# Patient Record
Sex: Male | Born: 1965
Health system: Southern US, Community
[De-identification: ages and names within clinical notes are randomized; demographics above are authoritative.]

## PROBLEM LIST (undated history)

## (undated) DIAGNOSIS — J45909 Unspecified asthma, uncomplicated: Secondary | ICD-10-CM

## (undated) DIAGNOSIS — K219 Gastro-esophageal reflux disease without esophagitis: Secondary | ICD-10-CM

## (undated) HISTORY — PX: MR SHOULDER RIGHT: HXRAD1806

---

## 2001-04-26 ENCOUNTER — Encounter: Admission: RE | Admit: 2001-04-26 | Discharge: 2001-04-26 | Payer: Self-pay | Admitting: Family Medicine

## 2001-04-26 ENCOUNTER — Encounter: Payer: Self-pay | Admitting: Family Medicine

## 2007-01-02 ENCOUNTER — Encounter: Admission: RE | Admit: 2007-01-02 | Discharge: 2007-01-02 | Payer: Self-pay | Admitting: Family Medicine

## 2007-03-20 ENCOUNTER — Ambulatory Visit (HOSPITAL_BASED_OUTPATIENT_CLINIC_OR_DEPARTMENT_OTHER): Admission: RE | Admit: 2007-03-20 | Discharge: 2007-03-20 | Payer: Self-pay | Admitting: Orthopaedic Surgery

## 2008-05-26 ENCOUNTER — Encounter: Admission: RE | Admit: 2008-05-26 | Discharge: 2008-05-26 | Payer: Self-pay | Admitting: Family Medicine

## 2010-11-11 ENCOUNTER — Encounter
Admission: RE | Admit: 2010-11-11 | Discharge: 2010-11-11 | Payer: Self-pay | Source: Home / Self Care | Attending: Family Medicine | Admitting: Family Medicine

## 2011-03-25 NOTE — Op Note (Signed)
Russell Benton, AROCHO             ACCOUNT NO.:  [id number]   MEDICAL RECORD NO.:  [id number]          PATIENT TYPE:  AMB   LOCATION:  DSC                          FACILITY:  [facility]   PHYSICIAN:  Lubertha Basque. Dalldorf, M.D.DATE OF BIRTH:  Jun 13, 1966   DATE OF PROCEDURE:  03/20/2007  DATE OF DISCHARGE:                               OPERATIVE REPORT   PREOPERATIVE DIAGNOSIS:  Left knee torn medial meniscus.   POSTOPERATIVE DIAGNOSIS:  Left knee torn medial meniscus.   PROCEDURE:  Left knee partial medial meniscectomy.   ANESTHESIA:  General.   ATTENDING SURGEON:  Lubertha Basque. Jerl Santos, M.D.   INDICATIONS FOR PROCEDURE:  The patient is a 45 year old man with about  a year of left knee pain [place]  This has persisted despite oral anti-inflammatories and activity  restriction.  He had undergone MRI scan which shows a medial meniscus  tear.  He has pain which limits his ability to remain active and to rest  and he is offered an arthroscopy.  Informed operative consent was  obtained after discussion of possible complications of reaction to  anesthesia and infection.  He is about 15 or 16 years from an  arthroscopy on this same knee.   SUMMARY OF FINDINGS AND PROCEDURE:  Under general anesthesia, an  arthroscopy left knee was performed.  The suprapatellar pouch was benign  as was the patellofemoral joint.  Medial compartment exhibited an  incarcerated medial meniscus tear of the middle horn.  This was pulled  into the joint followed by about 5% partial medial meniscectomy back to  stable tissues.  There were no degenerative changes to speak of.  The  lateral compartment was completely benign with no evidence of meniscal  or articular cartilage injury.  The ACL appeared to be intact.   DESCRIPTION OF PROCEDURE:  The patient taken to operating suite where  general anesthetic was applied without difficulty.  Positioned supine  and prepped, draped  normal sterile fashion.  After administration of  preop IV Kefzol an arthroscopy left knee was performed through a total  of two portals.  Findings were as noted above and procedure consisted of  the partial medial meniscectomy.  This was done by pulling the  incarcerated fragment into the medial compartment and then I used the  shaver to trim this back to stable tissues.  The knee was thoroughly  examined.  No other evidence of injury could be found.  The knee was  irrigated followed by placement of Marcaine with epinephrine and  morphine.  Adaptic was placed over the portals followed by dry gauze and  loose Ace wrap.  Estimated blood loss and intraoperative fluids can be  obtained from anesthesia records.   DISPOSITION:  The patient was extubated in the operating room and taken  to recovery room in stable addition.  He was to go home same-day follow  up in the office less than a week.  I will contact him by phone tonight.      Lubertha Basque Jerl Santos, M.D.  Electronically Signed  PGD/MEDQ  D:  03/20/2007  T:  03/20/2007  Job:  [id number]

## 2012-03-09 ENCOUNTER — Ambulatory Visit
Admission: RE | Admit: 2012-03-09 | Discharge: 2012-03-09 | Disposition: A | Discharge status: Home / Self Care | Payer: 59 | Source: Ambulatory Visit | Attending: Family Medicine | Admitting: Family Medicine

## 2012-03-09 ENCOUNTER — Other Ambulatory Visit: Payer: Self-pay | Admitting: Family Medicine

## 2012-03-09 DIAGNOSIS — T148XXA Other injury of unspecified body region, initial encounter: Secondary | ICD-10-CM

## 2016-12-14 ENCOUNTER — Other Ambulatory Visit: Payer: Self-pay | Admitting: Family Medicine

## 2016-12-14 ENCOUNTER — Ambulatory Visit
Admission: RE | Admit: 2016-12-14 | Discharge: 2016-12-14 | Disposition: A | Discharge status: Home / Self Care | Payer: 59 | Source: Ambulatory Visit | Attending: Family Medicine | Admitting: Family Medicine

## 2016-12-14 DIAGNOSIS — S6992XA Unspecified injury of left wrist, hand and finger(s), initial encounter: Secondary | ICD-10-CM | POA: Diagnosis not present

## 2016-12-14 DIAGNOSIS — M79645 Pain in left finger(s): Secondary | ICD-10-CM

## 2016-12-14 DIAGNOSIS — M7989 Other specified soft tissue disorders: Secondary | ICD-10-CM | POA: Diagnosis not present

## 2016-12-20 DIAGNOSIS — M79645 Pain in left finger(s): Secondary | ICD-10-CM | POA: Diagnosis not present

## 2017-06-09 DIAGNOSIS — Z23 Encounter for immunization: Secondary | ICD-10-CM | POA: Diagnosis not present

## 2017-06-09 DIAGNOSIS — Z13220 Encounter for screening for lipoid disorders: Secondary | ICD-10-CM | POA: Diagnosis not present

## 2017-06-09 DIAGNOSIS — Z0000 Encounter for general adult medical examination without abnormal findings: Secondary | ICD-10-CM | POA: Diagnosis not present

## 2017-07-20 ENCOUNTER — Other Ambulatory Visit: Payer: Self-pay | Admitting: Family Medicine

## 2017-07-20 ENCOUNTER — Ambulatory Visit
Admission: RE | Admit: 2017-07-20 | Discharge: 2017-07-20 | Disposition: A | Discharge status: Home / Self Care | Payer: 59 | Source: Ambulatory Visit | Attending: Family Medicine | Admitting: Family Medicine

## 2017-07-20 DIAGNOSIS — J45909 Unspecified asthma, uncomplicated: Secondary | ICD-10-CM | POA: Diagnosis not present

## 2017-07-20 DIAGNOSIS — Z77090 Contact with and (suspected) exposure to asbestos: Secondary | ICD-10-CM

## 2017-07-20 DIAGNOSIS — R0602 Shortness of breath: Secondary | ICD-10-CM | POA: Diagnosis not present

## 2017-07-20 DIAGNOSIS — J309 Allergic rhinitis, unspecified: Secondary | ICD-10-CM | POA: Diagnosis not present

## 2017-08-22 DIAGNOSIS — Z23 Encounter for immunization: Secondary | ICD-10-CM | POA: Diagnosis not present

## 2018-04-05 DIAGNOSIS — H5213 Myopia, bilateral: Secondary | ICD-10-CM | POA: Diagnosis not present

## 2018-04-05 DIAGNOSIS — H524 Presbyopia: Secondary | ICD-10-CM | POA: Diagnosis not present

## 2018-06-11 DIAGNOSIS — Z136 Encounter for screening for cardiovascular disorders: Secondary | ICD-10-CM | POA: Diagnosis not present

## 2018-06-11 DIAGNOSIS — Z0000 Encounter for general adult medical examination without abnormal findings: Secondary | ICD-10-CM | POA: Diagnosis not present

## 2018-06-11 DIAGNOSIS — Z1159 Encounter for screening for other viral diseases: Secondary | ICD-10-CM | POA: Diagnosis not present

## 2019-03-25 DIAGNOSIS — Z0000 Encounter for general adult medical examination without abnormal findings: Secondary | ICD-10-CM | POA: Diagnosis not present

## 2019-03-25 DIAGNOSIS — Z125 Encounter for screening for malignant neoplasm of prostate: Secondary | ICD-10-CM | POA: Diagnosis not present

## 2019-03-25 DIAGNOSIS — Z13220 Encounter for screening for lipoid disorders: Secondary | ICD-10-CM | POA: Diagnosis not present

## 2019-03-25 DIAGNOSIS — Z23 Encounter for immunization: Secondary | ICD-10-CM | POA: Diagnosis not present

## 2019-07-13 IMAGING — CR DG CHEST 2V
2 series · 2 of 2 positions shown · non-contrast
Comparison: None.

CLINICAL DATA: History of asbestos exposure.

EXAM:
CHEST  2 VIEW

[w chest pa]
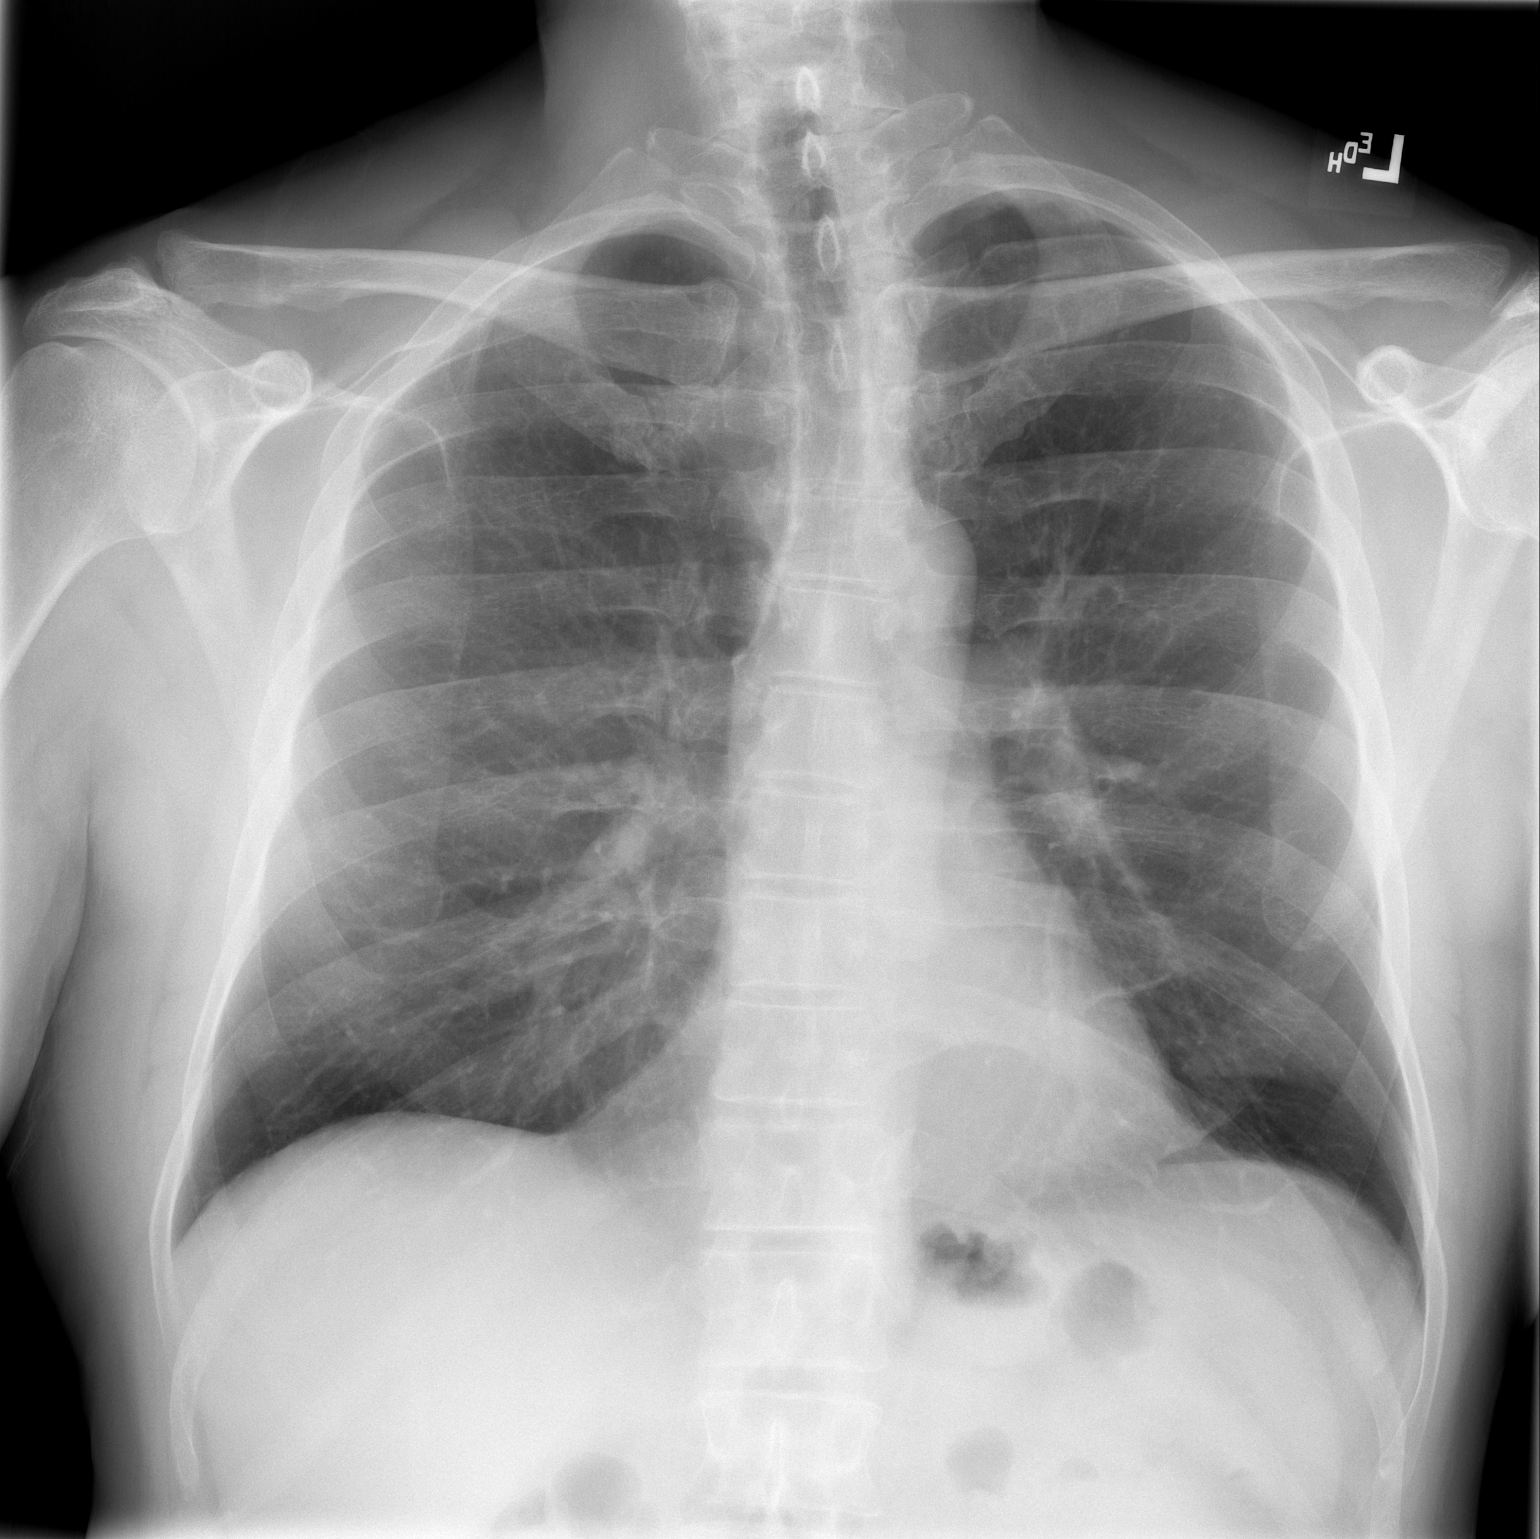

[w chest lat]
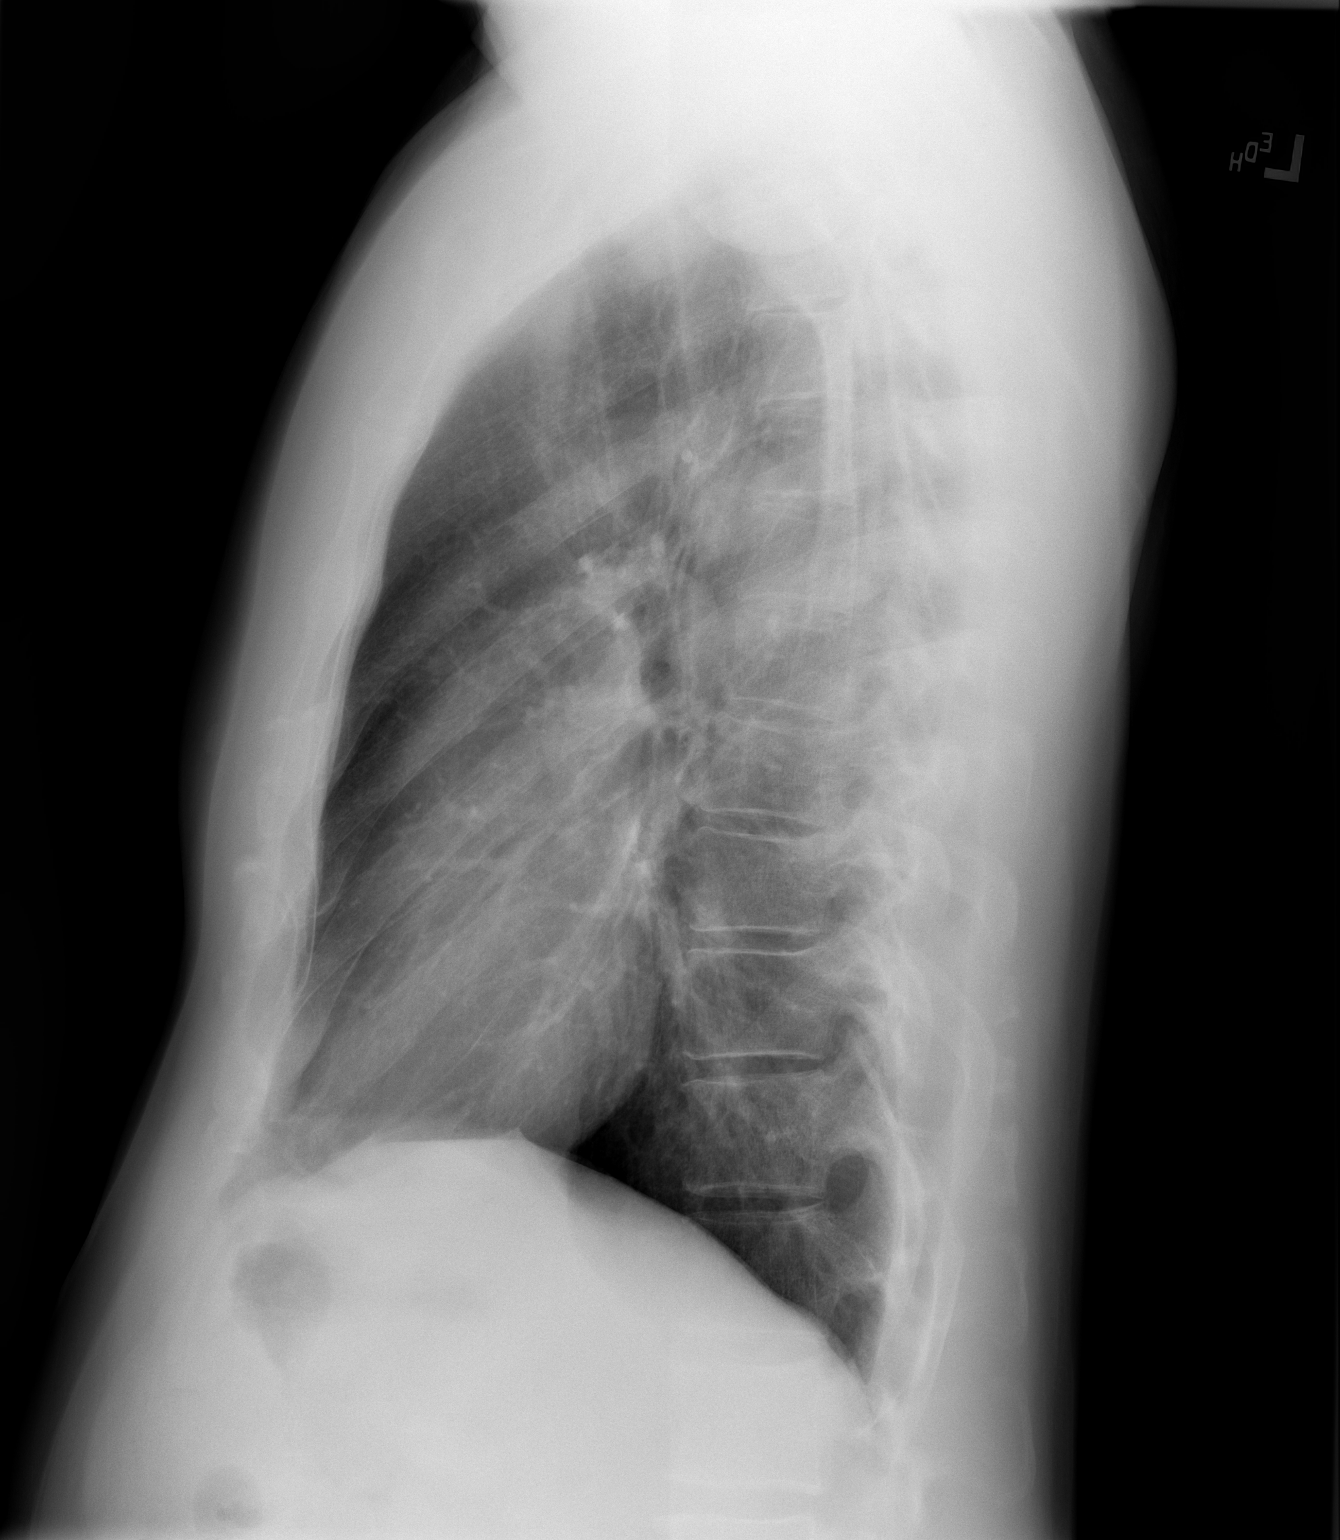

[2 of 2 positions shown; findings below may reference images not displayed]

FINDINGS: The heart size and mediastinal contours are within normal limits.
Both lungs are clear. The visualized skeletal structures are
unremarkable.
IMPRESSION: No active cardiopulmonary disease.

## 2019-09-09 ENCOUNTER — Other Ambulatory Visit: Payer: Self-pay | Admitting: Physician Assistant

## 2019-09-09 ENCOUNTER — Ambulatory Visit
Admission: RE | Admit: 2019-09-09 | Discharge: 2019-09-09 | Disposition: A | Discharge status: Home / Self Care | Payer: 59 | Source: Ambulatory Visit | Attending: Physician Assistant | Admitting: Physician Assistant

## 2019-09-09 DIAGNOSIS — T1490XA Injury, unspecified, initial encounter: Secondary | ICD-10-CM

## 2020-03-02 ENCOUNTER — Encounter (HOSPITAL_BASED_OUTPATIENT_CLINIC_OR_DEPARTMENT_OTHER): Payer: Self-pay | Admitting: Otolaryngology

## 2020-03-02 ENCOUNTER — Other Ambulatory Visit: Payer: Self-pay

## 2020-03-03 NOTE — H&P (Signed)
HPI:   Russell Benton is a 54 y.o. male who presents as a new Patient.   Referring Provider: Self, A Referral  Chief complaint: Nasal and throat congestion.  HPI: The past year or so he has had a problem where at night his nose gets very congested. He has been having fits of sneezing and nasal discharge. He has had to blow his nose a lot. He also has postnasal drainage. During the day he seems to be fine but is always at night. He also suffers with significant heartburn. He takes Tums about 4 or 5 times each week. He quit smoking years ago. He has cut back on alcohol recently but normally drinks 4-5 drinks in the evening. He also has cut back on caffeine, he currently drinks about 3 cups of coffee daily but used to drink a whole pot every day. He has been treated with antihistamines and antibiotics without much relief.  PMH/Meds/All/SocHx/FamHx/ROS:   Past Medical History:  Diagnosis Date  . Anxiety  . Asthma  . Migraine   Past Surgical History:  Procedure Laterality Date  . knee surgery   No family history of bleeding disorders, wound healing problems or difficulty with anesthesia.   Social History   Socioeconomic History  . Marital status: Married  Spouse name: Not on file  . Number of children: Not on file  . Years of education: Not on file  . Highest education level: Not on file  Occupational History  . Not on file  Social Needs  . Financial resource strain: Not on file  . Food insecurity  Worry: Not on file  Inability: Not on file  . Transportation needs  Medical: Not on file  Non-medical: Not on file  Tobacco Use  . Smoking status: Former Research scientist (life sciences)  . Smokeless tobacco: Never Used  Substance and Sexual Activity  . Alcohol use: Yes  . Drug use: No  . Sexual activity: Not on file  Lifestyle  . Physical activity  Days per week: Not on file  Minutes per session: Not on file  . Stress: Not on file  Relationships  . Social Medical illustrator on phone: Not on  file  Gets together: Not on file  Attends religious service: Not on file  Active member of club or organization: Not on file  Attends meetings of clubs or organizations: Not on file  Relationship status: Not on file  Other Topics Concern  . Not on file  Social History Narrative  . Not on file   No current outpatient medications on file.  A complete ROS was performed with pertinent positives/negatives noted in the HPI. The remainder of the ROS are negative.   Physical Exam:   BP 127/74  Pulse 66  Temp 97.3 F (36.3 C)  Ht 1.829 m (6')  Wt 90.3 kg (199 lb)  BMI 26.99 kg/m   General: Healthy and alert, in no distress, breathing easily. Normal affect. In a pleasant mood. Head: Normocephalic, atraumatic. No masses, or scars. Eyes: Pupils are equal, and reactive to light. Vision is grossly intact. No spontaneous or gaze nystagmus. Ears: Ear canals are clear. Tympanic membranes are intact, with normal landmarks and the middle ears are clear and healthy. Hearing: Grossly normal. Nose: Nasal cavities are patent with mild mucosal congestion and mucus exudate bilaterally but no polyps. Airways are patent. Face: No masses or scars, facial nerve function is symmetric. Oral Cavity: No mucosal abnormalities are noted. Tongue with normal mobility. Dentition appears healthy. Oropharynx: Tonsils are  symmetric. There are no mucosal masses identified. Tongue base appears normal and healthy. Larynx/Hypopharynx: deferred Chest: Deferred Neck: No palpable masses, no cervical adenopathy, no thyroid nodules or enlargement. Neuro: Cranial nerves II-XII with normal function. Balance: Normal gate. Other findings: none.  Independent Review of Additional Tests or Records:  none  Procedures:  none  Impression & Plans:  Chronic nocturnal nasal congestion with nasal discharge and postnasal drainage. He did not respond to amoxicillin. Recommend CT imaging of the sinuses to rule out any persistent  sinus disease.  Some of his symptoms are reflux related.We discussed causes of reflux, including lifestyle and dietary factors. Recommend strict avoidance of all tobacco, caffeine, alcohol, chocolate and peppermint. A reflux handout with more detailed instructions was provided to the patient.   CT scan reveals chronic pansinusitis. Recommend 2 weeks on clindamycin and then reevaluate.We discussed the importance of eating active or live culture yoghurt 2-3 times daily while taking the antibiotics. This can help avoid GI side effects.

## 2020-03-05 ENCOUNTER — Other Ambulatory Visit (HOSPITAL_COMMUNITY)
Admission: RE | Admit: 2020-03-05 | Discharge: 2020-03-05 | Disposition: A | Discharge status: Home / Self Care | Payer: 59 | Source: Ambulatory Visit | Attending: Otolaryngology | Admitting: Otolaryngology

## 2020-03-05 DIAGNOSIS — Z20822 Contact with and (suspected) exposure to covid-19: Secondary | ICD-10-CM | POA: Insufficient documentation

## 2020-03-05 DIAGNOSIS — Z01812 Encounter for preprocedural laboratory examination: Secondary | ICD-10-CM | POA: Diagnosis not present

## 2020-03-06 ENCOUNTER — Other Ambulatory Visit (HOSPITAL_COMMUNITY): Payer: 59

## 2020-03-06 LAB — SARS CORONAVIRUS 2 (TAT 6-24 HRS): SARS Coronavirus 2: NEGATIVE

## 2020-03-08 NOTE — Anesthesia Preprocedure Evaluation (Addendum)
Anesthesia Evaluation  Patient identified by MRN, date of birth, ID band Patient awake    Reviewed: Allergy & Precautions, NPO status , Patient's Chart, lab work & pertinent test results  History of Anesthesia Complications Negative for: history of anesthetic complications  Airway Mallampati: II  TM Distance: >3 FB Neck ROM: Full    Dental  (+) Dental Advisory Given, Teeth Intact   Pulmonary asthma ,    Pulmonary exam normal        Cardiovascular negative cardio ROS Normal cardiovascular exam     Neuro/Psych negative neurological ROS  negative psych ROS   GI/Hepatic Neg liver ROS, GERD  Controlled,  Endo/Other  negative endocrine ROS  Renal/GU negative Renal ROS     Musculoskeletal negative musculoskeletal ROS (+)   Abdominal   Peds  Hematology negative hematology ROS (+)   Anesthesia Other Findings Covid 4/29  Reproductive/Obstetrics                            Anesthesia Physical Anesthesia Plan  ASA: II  Anesthesia Plan: General   Post-op Pain Management:    Induction: Intravenous  PONV Risk Score and Plan: 2 and Treatment may vary due to age or medical condition, Ondansetron, Dexamethasone and Midazolam  Airway Management Planned: Oral ETT  Additional Equipment: None  Intra-op Plan:   Post-operative Plan: Extubation in OR  Informed Consent: I have reviewed the patients History and Physical, chart, labs and discussed the procedure including the risks, benefits and alternatives for the proposed anesthesia with the patient or authorized representative who has indicated his/her understanding and acceptance.     Dental advisory given  Plan Discussed with: CRNA and Anesthesiologist  Anesthesia Plan Comments:        Anesthesia Quick Evaluation

## 2020-03-09 ENCOUNTER — Ambulatory Visit (HOSPITAL_BASED_OUTPATIENT_CLINIC_OR_DEPARTMENT_OTHER): Payer: 59 | Admitting: Anesthesiology

## 2020-03-09 ENCOUNTER — Other Ambulatory Visit: Payer: Self-pay

## 2020-03-09 ENCOUNTER — Ambulatory Visit (HOSPITAL_BASED_OUTPATIENT_CLINIC_OR_DEPARTMENT_OTHER)
Admission: RE | Admit: 2020-03-09 | Discharge: 2020-03-09 | Disposition: A | Discharge status: Home / Self Care | Payer: 59 | Attending: Otolaryngology | Admitting: Otolaryngology

## 2020-03-09 ENCOUNTER — Encounter (HOSPITAL_BASED_OUTPATIENT_CLINIC_OR_DEPARTMENT_OTHER)
Admission: RE | Disposition: A | Discharge status: Home / Self Care | Payer: Self-pay | Source: Home / Self Care | Attending: Otolaryngology

## 2020-03-09 ENCOUNTER — Encounter (HOSPITAL_BASED_OUTPATIENT_CLINIC_OR_DEPARTMENT_OTHER): Payer: Self-pay | Admitting: Otolaryngology

## 2020-03-09 DIAGNOSIS — J324 Chronic pansinusitis: Secondary | ICD-10-CM | POA: Diagnosis not present

## 2020-03-09 DIAGNOSIS — Z87891 Personal history of nicotine dependence: Secondary | ICD-10-CM | POA: Insufficient documentation

## 2020-03-09 DIAGNOSIS — J45909 Unspecified asthma, uncomplicated: Secondary | ICD-10-CM | POA: Insufficient documentation

## 2020-03-09 HISTORY — PX: NASAL SINUS SURGERY: SHX719

## 2020-03-09 HISTORY — DX: Gastro-esophageal reflux disease without esophagitis: K21.9

## 2020-03-09 HISTORY — DX: Unspecified asthma, uncomplicated: J45.909

## 2020-03-09 SURGERY — SINUS SURGERY, ENDOSCOPIC
Anesthesia: General | Site: Nose | Laterality: Bilateral

## 2020-03-09 MED ORDER — HYDROCODONE-ACETAMINOPHEN 7.5-325 MG PO TABS: 1.0000 | ORAL_TABLET | Freq: Four times a day (QID) | ORAL | 0 refills | Status: DC | PRN

## 2020-03-09 MED ORDER — OXYCODONE HCL 5 MG PO TABS: 5.0000 mg | ORAL_TABLET | Freq: Once | ORAL | Status: DC | PRN

## 2020-03-09 MED ORDER — FENTANYL CITRATE (PF) 100 MCG/2ML IJ SOLN
25.0000 ug | INTRAMUSCULAR | Status: DC | PRN
  Administered 2020-03-09 (×3): 50 ug via INTRAVENOUS

## 2020-03-09 MED ORDER — MIDAZOLAM HCL 2 MG/2ML IJ SOLN
INTRAMUSCULAR | Status: AC
  Filled 2020-03-09: qty 2

## 2020-03-09 MED ORDER — SUGAMMADEX SODIUM 200 MG/2ML IV SOLN
INTRAVENOUS | Status: DC | PRN
  Administered 2020-03-09: 200 mg via INTRAVENOUS

## 2020-03-09 MED ORDER — PROMETHAZINE HCL 25 MG RE SUPP: 25.0000 mg | Freq: Four times a day (QID) | RECTAL | 1 refills | Status: DC | PRN

## 2020-03-09 MED ORDER — FENTANYL CITRATE (PF) 100 MCG/2ML IJ SOLN: 50.0000 ug | INTRAMUSCULAR | Status: DC | PRN

## 2020-03-09 MED ORDER — FENTANYL CITRATE (PF) 250 MCG/5ML IJ SOLN
INTRAMUSCULAR | Status: DC | PRN
  Administered 2020-03-09 (×2): 50 ug via INTRAVENOUS
  Administered 2020-03-09: 100 ug via INTRAVENOUS

## 2020-03-09 MED ORDER — LIDOCAINE-EPINEPHRINE 1 %-1:100000 IJ SOLN
INTRAMUSCULAR | Status: AC
  Filled 2020-03-09: qty 1

## 2020-03-09 MED ORDER — LACTATED RINGERS IV SOLN: INTRAVENOUS | Status: DC

## 2020-03-09 MED ORDER — FENTANYL CITRATE (PF) 100 MCG/2ML IJ SOLN
INTRAMUSCULAR | Status: AC
  Filled 2020-03-09: qty 2

## 2020-03-09 MED ORDER — LIDOCAINE-EPINEPHRINE 1 %-1:100000 IJ SOLN
INTRAMUSCULAR | Status: DC | PRN
  Administered 2020-03-09: 9 mL

## 2020-03-09 MED ORDER — OXYMETAZOLINE HCL 0.05 % NA SOLN
NASAL | Status: AC
  Filled 2020-03-09: qty 30

## 2020-03-09 MED ORDER — ROCURONIUM BROMIDE 10 MG/ML (PF) SYRINGE
PREFILLED_SYRINGE | INTRAVENOUS | Status: DC | PRN
  Administered 2020-03-09: 50 mg via INTRAVENOUS

## 2020-03-09 MED ORDER — MIDAZOLAM HCL 5 MG/5ML IJ SOLN
INTRAMUSCULAR | Status: DC | PRN
  Administered 2020-03-09: 2 mg via INTRAVENOUS

## 2020-03-09 MED ORDER — DEXAMETHASONE SODIUM PHOSPHATE 10 MG/ML IJ SOLN
INTRAMUSCULAR | Status: DC | PRN
  Administered 2020-03-09: 10 mg via INTRAVENOUS

## 2020-03-09 MED ORDER — ONDANSETRON HCL 4 MG/2ML IJ SOLN
INTRAMUSCULAR | Status: DC | PRN
  Administered 2020-03-09: 4 mg via INTRAVENOUS

## 2020-03-09 MED ORDER — OXYMETAZOLINE HCL 0.05 % NA SOLN
NASAL | Status: DC | PRN
  Administered 2020-03-09: 1 via TOPICAL

## 2020-03-09 MED ORDER — PROMETHAZINE HCL 25 MG/ML IJ SOLN: 6.2500 mg | INTRAMUSCULAR | Status: DC | PRN

## 2020-03-09 MED ORDER — MUPIROCIN 2 % EX OINT
TOPICAL_OINTMENT | CUTANEOUS | Status: AC
  Filled 2020-03-09: qty 22

## 2020-03-09 MED ORDER — OXYCODONE HCL 5 MG/5ML PO SOLN: 5.0000 mg | Freq: Once | ORAL | Status: DC | PRN

## 2020-03-09 MED ORDER — METHOCARBAMOL 500 MG PO TABS
500.0000 mg | ORAL_TABLET | Freq: Once | ORAL | Status: AC
  Administered 2020-03-09: 500 mg via ORAL
  Filled 2020-03-09: qty 1

## 2020-03-09 MED ORDER — BACITRACIN 500 UNIT/GM EX OINT
TOPICAL_OINTMENT | CUTANEOUS | Status: DC | PRN
  Administered 2020-03-09: 1 via TOPICAL

## 2020-03-09 MED ORDER — OXYMETAZOLINE HCL 0.05 % NA SOLN
2.0000 | NASAL | Status: DC
  Administered 2020-03-09: 2 via NASAL

## 2020-03-09 MED ORDER — PROPOFOL 10 MG/ML IV BOLUS
INTRAVENOUS | Status: DC | PRN
  Administered 2020-03-09: 200 mg via INTRAVENOUS

## 2020-03-09 MED ORDER — BACITRACIN ZINC 500 UNIT/GM EX OINT
TOPICAL_OINTMENT | CUTANEOUS | Status: AC
  Filled 2020-03-09: qty 28.35

## 2020-03-09 MED ORDER — MIDAZOLAM HCL 2 MG/2ML IJ SOLN: 1.0000 mg | INTRAMUSCULAR | Status: DC | PRN

## 2020-03-09 MED ORDER — LIDOCAINE 2% (20 MG/ML) 5 ML SYRINGE
INTRAMUSCULAR | Status: DC | PRN
  Administered 2020-03-09: 60 mg via INTRAVENOUS

## 2020-03-09 MED ORDER — PROPOFOL 10 MG/ML IV BOLUS
INTRAVENOUS | Status: AC
  Filled 2020-03-09: qty 20

## 2020-03-09 SURGICAL SUPPLY — 46 items
ATTRACTOMAT 16X20 MAGNETIC DRP (DRAPES) IMPLANT
BLADE RAD40 ROTATE 4M 4 5PK (BLADE) IMPLANT
BLADE RAD60 ROTATE M4 4 5PK (BLADE) IMPLANT
BLADE TRICUT ROTATE M4 4 5PK (BLADE) ×1 IMPLANT
BUR HS RAD FRONTAL 3 (BURR) IMPLANT
CANISTER SUC SOCK COL 7IN (MISCELLANEOUS) ×2 IMPLANT
CANISTER SUCT 1200ML W/VALVE (MISCELLANEOUS) ×4 IMPLANT
CORD BIPOLAR FORCEPS 12FT (ELECTRODE) IMPLANT
COVER WAND RF STERILE (DRAPES) IMPLANT
DECANTER SPIKE VIAL GLASS SM (MISCELLANEOUS) ×1 IMPLANT
DRESSING NASAL KENNEDY 3.5X.9 (MISCELLANEOUS) IMPLANT
DRSG CURAD 3X16 NADH (PACKING) IMPLANT
DRSG NASAL KENNEDY 3.5X.9 (MISCELLANEOUS)
DRSG NASAL KENNEDY LMNT 8CM (GAUZE/BANDAGES/DRESSINGS) IMPLANT
DRSG NASOPORE 8CM (GAUZE/BANDAGES/DRESSINGS) ×4 IMPLANT
DRSG TELFA 3X8 NADH (GAUZE/BANDAGES/DRESSINGS) IMPLANT
ELECT REM PT RETURN 9FT ADLT (ELECTROSURGICAL) ×2
ELECTRODE REM PT RTRN 9FT ADLT (ELECTROSURGICAL) IMPLANT
FORCEPS BIPOLAR SPETZLER 8 1.0 (NEUROSURGERY SUPPLIES) IMPLANT
GAUZE 4X4 16PLY RFD (DISPOSABLE) IMPLANT
GAUZE VASELINE FOILPK 1/2 X 72 (GAUZE/BANDAGES/DRESSINGS) IMPLANT
GLOVE BIOGEL PI IND STRL 7.0 (GLOVE) IMPLANT
GLOVE BIOGEL PI INDICATOR 7.0 (GLOVE) ×1
GLOVE ECLIPSE 7.5 STRL STRAW (GLOVE) ×2 IMPLANT
GLOVE SURG SS PI 7.0 STRL IVOR (GLOVE) ×1 IMPLANT
GOWN STRL REUS W/ TWL LRG LVL3 (GOWN DISPOSABLE) ×2 IMPLANT
GOWN STRL REUS W/TWL LRG LVL3 (GOWN DISPOSABLE) ×4
HEMOSTAT SURGICEL .5X2 ABSORB (HEMOSTASIS) IMPLANT
IV NS 500ML (IV SOLUTION)
IV NS 500ML BAXH (IV SOLUTION) IMPLANT
NDL PRECISIONGLIDE 27X1.5 (NEEDLE) ×1 IMPLANT
NDL SPNL 25GX3.5 QUINCKE BL (NEEDLE) IMPLANT
NEEDLE PRECISIONGLIDE 27X1.5 (NEEDLE) ×2 IMPLANT
NEEDLE SPNL 25GX3.5 QUINCKE BL (NEEDLE) IMPLANT
NS IRRIG 1000ML POUR BTL (IV SOLUTION) ×2 IMPLANT
PACK ENT DAY SURGERY (CUSTOM PROCEDURE TRAY) ×2 IMPLANT
PAD DRESSING TELFA 3X8 NADH (GAUZE/BANDAGES/DRESSINGS) IMPLANT
PATTIES SURGICAL .5 X3 (DISPOSABLE) ×2 IMPLANT
SET BASIN DAY SURGERY F.S. (CUSTOM PROCEDURE TRAY) ×2 IMPLANT
SLEEVE SCD COMPRESS KNEE MED (MISCELLANEOUS) ×2 IMPLANT
SOLUTION BUTLER CLEAR DIP (MISCELLANEOUS) ×2 IMPLANT
SPONGE GAUZE 2X2 8PLY STRL LF (GAUZE/BANDAGES/DRESSINGS) ×2 IMPLANT
SPONGE SURGIFOAM ABS GEL 12-7 (HEMOSTASIS) IMPLANT
TOWEL GREEN STERILE FF (TOWEL DISPOSABLE) ×2 IMPLANT
TUBE CONNECTING 20X1/4 (TUBING) ×1 IMPLANT
YANKAUER SUCT BULB TIP NO VENT (SUCTIONS) ×2 IMPLANT

## 2020-03-09 NOTE — Transfer of Care (Signed)
Immediate Anesthesia Transfer of Care Note  Patient: Russell Benton  Procedure(s) Performed: Endoscopic Sinus Surgery: Maxillary, Ethmoid, and Frontal (Bilateral Nose)  Patient Location: PACU  Anesthesia Type:General  Level of Consciousness: awake, alert , oriented and patient cooperative  Airway & Oxygen Therapy: Patient Spontanous Breathing and Patient connected to face mask oxygen  Post-op Assessment: Report given to RN, Post -op Vital signs reviewed and stable and Patient moving all extremities  Post vital signs: Reviewed and stable  Last Vitals:  Vitals Value Taken Time  BP 142/83 03/09/20 1001  Temp    Pulse 77 03/09/20 1004  Resp 15 03/09/20 1004  SpO2 100 % 03/09/20 1004  Vitals shown include unvalidated device data.  Last Pain:  Vitals:   03/09/20 0806  TempSrc:   PainSc: 0-No pain      Patients Stated Pain Goal: 4 ([id number])  Complications: No apparent anesthesia complications

## 2020-03-09 NOTE — Interval H&P Note (Signed)
History and Physical Interval Note:  03/09/2020 8:11 AM  Russell Benton  has presented today for surgery, with the diagnosis of chronic pansinusitis.  The various methods of treatment have been discussed with the patient and family. After consideration of risks, benefits and other options for treatment, the patient has consented to  Procedure(s): ENDOSCOPIC SINUS SURGERY/Ethmoid and maxillary (Bilateral) as a surgical intervention.  The patient's history has been reviewed, patient examined, no change in status, stable for surgery.  I have reviewed the patient's chart and labs.  Questions were answered to the patient's satisfaction.     Izora Gala

## 2020-03-09 NOTE — Anesthesia Postprocedure Evaluation (Signed)
Anesthesia Post Note  Patient: Russell Benton  Procedure(s) Performed: Endoscopic Sinus Surgery: Maxillary, Ethmoid, and Frontal (Bilateral Nose)     Patient location during evaluation: PACU Anesthesia Type: General Level of consciousness: awake and alert Pain management: pain level controlled Vital Signs Assessment: post-procedure vital signs reviewed and stable Respiratory status: spontaneous breathing, nonlabored ventilation and respiratory function stable Cardiovascular status: blood pressure returned to baseline and stable Postop Assessment: no apparent nausea or vomiting Anesthetic complications: no    Last Vitals:  Vitals:   03/09/20 1100 03/09/20 1108  BP: (!) 144/86   Pulse: 84 81  Resp: 15 10  Temp:    SpO2: 97% 97%    Last Pain:  Vitals:   03/09/20 1100  TempSrc:   PainSc: [place]

## 2020-03-09 NOTE — Op Note (Signed)
OPERATIVE REPORT  DATE OF SURGERY: 03/09/2020  PATIENT:  Russell Benton,  54 y.o. male  PRE-OPERATIVE DIAGNOSIS:  chronic pansinusitis  POST-OPERATIVE DIAGNOSIS:  chronic pansinusitis  PROCEDURE:  Procedure(s): Endoscopic Sinus Surgery: Maxillary, Ethmoid, and Frontal, bilateral  SURGEON:  Beckie Salts, MD  ASSISTANTS: None  ANESTHESIA:   General   EBL: 100 ml  DRAINS: None  LOCAL MEDICATIONS USED: 1% Xylocaine with epinephrine  SPECIMEN: Bilateral nasal and sinus contents  COUNTS:  Correct  PROCEDURE DETAILS: The patient was taken to the operating room and placed on the operating table in the supine position. Following induction of general endotracheal anesthesia, the face was prepped and draped in standard fashion.  Oxymetazoline spray was used preoperatively in the nasal cavities.  1% Xylocaine with epinephrine was infiltrated into the superior and posterior attachments of the middle turbinate and the lateral nasal wall bilaterally.  Afrin pledgets were then placed bilaterally in the middle meatus.  1.  Bilateral total endoscopic ethmoidectomy.  Left side was dissected first.  The uncinate process was taken down with a sickle knife.  The microdebrider was then used to open the bulla and break down all bony partitions between the anterior ethmoid cells.  The ground lamella was taken down exposing posterior cells.  Complete ethmoidectomy was accomplished all the way laterally to the lamina papyracea.  There is a small dehiscence superiorly in the lamina with orbital fat that was exposed.  There is no bleeding.  The superior limit of dissection was the fovea which was intact.  There is diffuse polypoid changes throughout the majority of the ethmoid cells.  All of this was cleaned out.  Right side was then dissected in a similar fashion.  There is no bony dehiscence of the lamina on the right.  Otherwise the dissection was accomplished in exactly the same fashion.  2.  Bilateral  endoscopic frontal sinusotomy.  After the ethmoid dissection was completed the frontal recess was inspected and cleaned of diffuse polypoid tissue on both sides.  The frontal duct was opened and exposed.  There was easy access and transillumination on the right side but the left side was more difficult due to a high septal deviation.  A small thin piece of nasal pore packing was packed into the frontal duct on both sides.  3.  Bilateral endoscopic maxillary antrostomy.  On the right side the fontanelle was identified but there is no natural ostium visualized.  This was opened using a curved suction and enlarged anteriorly using backbiting forceps and posteriorly using Tru-Cut forceps.  On the left side the natural ostium was identified and was enlarged in a similar fashion.  The ethmoid cavities were packed bilaterally with half of a nasal pore dressing.  The pharynx was suctioned blood and secretions.  Patient was awakened extubated and transferred to recovery in stable condition.    PATIENT DISPOSITION:  To PACU, stable

## 2020-03-09 NOTE — Discharge Instructions (Signed)
Avoid bending over, strenuous activity, blowing your nose.  If you have to sneeze open your mouth.  Start using nasal saline spray on both sides every hour while awake.  You may start this on Tuesday.   Post Anesthesia Home Care Instructions  Activity: Get plenty of rest for the remainder of the day. A responsible individual must stay with you for 24 hours following the procedure.  For the next 24 hours, DO NOT: -Drive a car -Paediatric nurse -Drink alcoholic beverages -Take any medication unless instructed by your physician -Make any legal decisions or sign important papers.  Meals: Start with liquid foods such as gelatin or soup. Progress to regular foods as tolerated. Avoid greasy, spicy, heavy foods. If nausea and/or vomiting occur, drink only clear liquids until the nausea and/or vomiting subsides. Call your physician if vomiting continues.  Special Instructions/Symptoms: Your throat may feel dry or sore from the anesthesia or the breathing tube placed in your throat during surgery. If this causes discomfort, gargle with warm salt water. The discomfort should disappear within 24 hours.  Call your surgeon if you experience:   1.  Fever over 101.0. 2.  Inability to urinate. 3.  Nausea and/or vomiting. 4.  Extreme swelling or bruising at the surgical site. 5.  Continued bleeding from the incision. 6.  Increased pain, redness or drainage from the incision. 7.  Problems related to your pain medication. 8.  Any problems and/or concerns

## 2020-03-09 NOTE — Anesthesia Procedure Notes (Signed)
Procedure Name: Intubation Performed by: Milford Cage, CRNA Pre-anesthesia Checklist: Patient identified, Emergency Drugs available, Suction available and Patient being monitored Patient Re-evaluated:Patient Re-evaluated prior to induction Oxygen Delivery Method: Circle System Utilized Preoxygenation: Pre-oxygenation with 100% oxygen Induction Type: IV induction Ventilation: Mask ventilation without difficulty Laryngoscope Size: Mac and 4 Grade View: Grade I Tube type: Oral Tube size: 7.0 mm Number of attempts: 1 Airway Equipment and Method: Stylet and Oral airway Placement Confirmation: ETT inserted through vocal cords under direct vision,  positive ETCO2 and breath sounds checked- equal and bilateral Secured at: 24 cm Tube secured with: Tape Dental Injury: Teeth and Oropharynx as per pre-operative assessment

## 2020-03-10 ENCOUNTER — Encounter: Payer: Self-pay | Admitting: *Deleted

## 2020-03-10 LAB — SURGICAL PATHOLOGY

## 2020-06-04 ENCOUNTER — Other Ambulatory Visit: Payer: Self-pay | Admitting: Family Medicine

## 2020-06-04 ENCOUNTER — Ambulatory Visit
Admission: RE | Admit: 2020-06-04 | Discharge: 2020-06-04 | Disposition: A | Discharge status: Home / Self Care | Payer: 59 | Source: Ambulatory Visit | Attending: Family Medicine | Admitting: Family Medicine

## 2020-06-04 DIAGNOSIS — R059 Cough, unspecified: Secondary | ICD-10-CM

## 2020-06-10 NOTE — Progress Notes (Signed)
New Patient Note  RE: Russell Benton MRN: [id number] DOB: Feb 18, 1966 Date of Office Visit: 06/11/2020  Referring provider: Izora Gala, MD Primary care provider: Kelton Pillar, MD  Chief Complaint: Cough  History of Present Illness: I had the pleasure of seeing Russell Benton for initial evaluation at the [facility] on 06/11/2020. He is a 54 y.o. male, who is referred here by Dr. Constance Holster (ENT) for the evaluation of nasal congestion. Patient was seen by [facility] in March 2021.  Patient completed his Moderna vaccinations at the end of April. About 8 days afterwards on 03/09/2020 patient had sinus surgery for chronic pansinuitis. He was treated with clindamycin prior to the surgery with no benefit.   After the surgery, he developed anosmia and ageusia. Initially thought it was due to the nasal packing post surgery. Had rapid covid-19 testing done which was negative on 03/16/2020 as he developed chills and flu like symptoms as well. Patient thinks it may have been a poor sample as he had lots of nasal discharge due to the recent nasal surgery.   He also developed a cough after the surgery as well. Initially he thought it was from the anesthesia but the cough has persisted for the past 3 months now.   He had negative COVID-19 testing last week again. No prior COVID-19 diagnosis.  Main complaint of coughing, feeling fatigued, exhausted, anosmia and ageusia.   Rhinitis:  He reports symptoms of nasal congestion, sneezing, rhinorrhea. Symptoms have been going on for 10+ years but worse the past few years. The symptoms are present all year around with worsening in spring and fall. Anosmia: yes. Headache: no. He has used benadryl, Claritin, allegra, Xyzal, Singulair, Flonase with minimal improvement in symptoms. Sinus infections: yes. Previous work up includes: skin testing in March 2021 (by outside allergist) positive to trees, grass, weed, mold, dog, cat, dust mites,  cockroach, feathers. Negative to foods. No previous allergy injections.  Previous ENT evaluation: yes.  Noted small polyps on the right side post surgery via laryngoscopy. Nettipot is actually making symptoms worse.  Previous sinus imaging: yes. History of nasal polyps: yes. History of reflux: prescribed Prilosec recently but has not started taking medications. Symptoms improved since decreased alcohol and caffeine intake.   Breathing: He reports symptoms of chest tightness, shortness of breath, coughing, wheezing, nocturnal awakenings for 50+ years.  Coughing is worse in the mornings and phlegm is discolored since May.  No recent antibiotics or prednisone use. Recent CXR was normal.  Current medications include albuterol prn and Advair 169mcg 1 puff BID. He was only during the spring and fall but now taking it more consistently.  He reports not using aerochamber with inhalers. He tried the following inhalers: none. Main triggers are allergies, infections. In the last month, frequency of symptoms: daily however not using albuterol during these episodes. He has been doing breathing exercises as he does not like the way albuterol makes him feel - denies palpations but states it makes him feel weird.  Frequency of nocturnal symptoms: 0x/month. Frequency of SABA use: 0x/week. Interference with physical activity: no. Sleep is disturbed. In the last 12 months, oral steroids courses: no. Lifetime history of hospitalization for respiratory issues: no. Prior intubations: no. History of pneumonia: no. He was evaluated by allergist in the past. Smoking exposure: quit in 1990. Up to date with flu vaccine: yes. Up to date with pneumonia vaccine: no. Up to date with COVID-19 vaccine: yes.   Infections: Patient  has history of multiple sinus infections. Denies any pneumonia, ear infections, GI infections/diarrhea, skin infections/abscesses. Patient also has no history of opportunistic infections including fungal  infections, viral infections.   Patient reports 2-3 antibiotic use in the last 12 months and 0 hospital admissions. Patient does not have any secondary causes of immunodeficiency including chronic steroid use, diabetes mellitus, protein losing enteropathy, renal or hepatic dysfunction, history of cancer or irradiation or history of HIV, hepatitis B or C.  06/04/2020 CXR: "No acute cardiopulmonary disease."  Assessment and Plan: Russell Benton is a 54 y.o. male with: Other allergic rhinitis Perennial rhinitis symptoms for 10+ years which flares in the spring and fall. The last few years noticed worsening symptoms. Skin testing in March 2021 by outside allergist was positive to trees, grass, weed, mold, dog, cat, dust mites, cockroach, feathers. Not interested in allergy injections due to time commitment. Evaluated by ENT and CT sinus showed pansinusitis - no improvement with clindamycin. Underwent sinus surgery on 03/09/2020 and since then had anosmia, ageusia, fatigue, coughing. He received his second Moderna vaccine 8 days before surgery. Had negative covid-19 testing x 2. Nettipot worsens symptoms. Currently on Allegra, Singulair and Flonase with minimal benefit.  Continue environmental control measures.   Allergy immunotherapy is most likely a good long term options.  May use over the counter antihistamines such as Allegra (fexofenadine) daily.  Continue montelukast 10mg  daily.   Nasal polyps ENT noted polyps on the right side status post surgery via rhinoscopy. Still having anosmia.  Start Xhance 2 sprays per nostril twice a day. Sample given. Demonstrated proper use.  Stop using all other nasal sprays.  May use nasal saline spray (i.e., Simply Saline) as needed.  Read about Dupixent injections - this medication is approved for polyps and asthma which may be ideal for this patient as he has both.   Anosmia  See assessment and plan as above.  If no improvement with Xhance then will discuss  smell retraining therapy next.   Not well controlled moderate persistent asthma Patient diagnosed with asthma as a child and only needed to use Advair 118mcg 1 puff twice a day during the spring and fall seasons however since May he has been having issues with daily coughing which is worse in the mornings. Initially thought it was from the anesthesia but now persisting. He is not using albuterol during these episodes as he has been doing some type of breathing exercises. The albuterol makes him feel unusual. Normal CXR.  Today's spirometry showed: some restriction with 18% improvement in FEV1 post bronchodilator treatment.  Tolerated xopenex better than albuterol and will send in an Rx for xopenex. Daily controller medication(s): start using Advair 190mcg 1 puff twice a day and rinse mouth afterwards everyday. Continue with montelukast 10mg  daily at night. Cautioned that in some children/adults can experience behavioral changes including hyperactivity, agitation, depression, sleep disturbances and suicidal ideations. These side effects are rare, but if you notice them you should notify me and discontinue Singulair (montelukast). May use levoalbuterol rescue inhaler 2 puffs every 4 to 6 hours as needed for shortness of breath, chest tightness, coughing, and wheezing. Monitor frequency of use.   Spacer given and demonstrated proper use with inhaler. Patient understood technique and all questions/concerned were addressed.  Repeat spirometry at next visit.  Heartburn Heartburn symptoms improved since cutting down on alcohol and caffeine. ENT prescribed Prilosec but has not started it yet.  Continue lifestyle modifications.   Start taking Prilosec in the morning as prescribed  by Dr. Constance Holster - nothing to eat or drink 30 minutes afterwards.  Return in about 2 months (around 08/11/2020).  Meds ordered this encounter  Medications  . levalbuterol (XOPENEX HFA) 45 MCG/ACT inhaler    Sig: Take 2 puffs  every 4-6 hours as needed for coughing, wheezing, chest tightness or shortness of breath. Use with a spacer.    Dispense:  1 Inhaler    Refill:  3  . Fluticasone Propionate (XHANCE) 93 MCG/ACT EXHU    Sig: Place 2 sprays into the nose in the morning and at bedtime.    Dispense:  32 mL    Refill:  5    For nasal polyps   Other allergy screening: Food allergy: no Medication allergy: no Hymenoptera allergy: no Urticaria: no Eczema:no  Diagnostics: Spirometry:  Tracings reviewed. His effort: It was hard to get consistent efforts and there is a question as to whether this reflects a maximal maneuver. FVC: 2.66L FEV1: 2.51L, 64% predicted FEV1/FVC ratio: 94% Interpretation: Spirometry consistent with possible restrictive disease with 18% improvement in FEV1 post bronchodilator treatment.  Please see scanned spirometry results for details.  Past Medical History: Patient Active Problem List   Diagnosis Date Noted  . Not well controlled moderate persistent asthma 06/11/2020  . Heartburn 06/11/2020  . Other allergic rhinitis 06/11/2020  . Nasal polyps 06/11/2020  . Anosmia 06/11/2020   Past Medical History:  Diagnosis Date  . Asthma   . GERD (gastroesophageal reflux disease)    Past Surgical History: Past Surgical History:  Procedure Laterality Date  . NASAL SINUS SURGERY Bilateral 03/09/2020   Procedure: Endoscopic Sinus Surgery: Maxillary, Ethmoid, and Frontal;  Surgeon: Izora Gala, MD;  Location: [facility];  Service: ENT;  Laterality: Bilateral;  . NASAL SINUS SURGERY  03/09/2020   Medication List:  Current Outpatient Medications  Medication Sig Dispense Refill  . diazepam (VALIUM) 5 MG tablet SMARTSIG:1 Tablet(s) By Mouth 1 to 2 Times Daily    . fexofenadine (ALLEGRA) 180 MG tablet Take by mouth.    . Fluticasone-Salmeterol (ADVAIR) 100-50 MCG/DOSE AEPB Inhale 1 puff into the lungs 2 (two) times daily.    . montelukast (SINGULAIR) 10 MG tablet Take 10 mg  by mouth daily.    Marland Kitchen omeprazole (PRILOSEC) 20 MG capsule Take 20 mg by mouth daily.    . promethazine (PHENERGAN) 25 MG suppository Place 1 suppository (25 mg total) rectally every 6 (six) hours as needed for nausea or vomiting. 12 suppository 1  . sildenafil (VIAGRA) 50 MG tablet Take 50 mg by mouth as needed.    . Fluticasone Propionate (XHANCE) 93 MCG/ACT EXHU Place 2 sprays into the nose in the morning and at bedtime. 32 mL 5  . HYDROcodone-acetaminophen (NORCO) 7.5-325 MG tablet Take 1 tablet by mouth every 6 (six) hours as needed for moderate pain. (Patient not taking: Reported on 06/11/2020) 20 tablet 0  . levalbuterol (XOPENEX HFA) 45 MCG/ACT inhaler Take 2 puffs every 4-6 hours as needed for coughing, wheezing, chest tightness or shortness of breath. Use with a spacer. 1 Inhaler 3   No current facility-administered medications for this visit.   Allergies: No Known Allergies Social History: Social History   Socioeconomic History  . Marital status: Married    Spouse name: Not on file  . Number of children: Not on file  . Years of education: Not on file  . Highest education level: Not on file  Occupational History  . Not on file  Tobacco Use  .  Smoking status: Former Smoker    Quit date: 01/13/1989    Years since quitting: 31.4  . Smokeless tobacco: Never Used  Vaping Use  . Vaping Use: Never used  Substance and Sexual Activity  . Alcohol use: Yes    Comment: 3-4 a day  . Drug use: Never  . Sexual activity: Not on file  Other Topics Concern  . Not on file  Social History Narrative  . Not on file   Social Determinants of Health   Financial Resource Strain:   . Difficulty of Paying Living Expenses:   Food Insecurity:   . Worried About Charity fundraiser in the Last Year:   . Arboriculturist in the Last Year:   Transportation Needs:   . Film/video editor (Medical):   Marland Kitchen Lack of Transportation (Non-Medical):   Physical Activity:   . Days of Exercise per Week:     . Minutes of Exercise per Session:   Stress:   . Feeling of Stress :   Social Connections:   . Frequency of Communication with Friends and Family:   . Frequency of Social Gatherings with Friends and Family:   . Attends Religious Services:   . Active Member of Clubs or Organizations:   . Attends Archivist Meetings:   Marland Kitchen Marital Status:    Lives in a house which is about 56 years old. Smoking: quit in 1990s Occupation: lawyer  Environmental History: Water Damage/mildew in the house: no Carpet in the family room: no Carpet in the bedroom: yes Heating: gas Cooling: central Pet: 1 dog x 14 yrs but not there anymore  Family History: Family History  Problem Relation Age of Onset  . Eczema Paternal Uncle   . Asthma Maternal Grandfather    Problem                               Relation Immunodeficiency   No  Review of Systems  Constitutional: Negative for appetite change, chills, fever and unexpected weight change.  HENT: Positive for congestion and postnasal drip. Negative for rhinorrhea.   Eyes: Negative for itching.  Respiratory: Positive for cough, chest tightness and shortness of breath. Negative for wheezing.   Cardiovascular: Negative for chest pain.  Gastrointestinal: Negative for abdominal pain.  Genitourinary: Negative for difficulty urinating.  Skin: Negative for rash.  Allergic/Immunologic: Positive for environmental allergies.  Neurological: Negative for headaches.   Objective: BP 118/72   Pulse 68   Temp 97.8 F (36.6 C) (Temporal)   Resp 17   Ht 6' (1.829 m)   Wt 198 lb 8 oz (90 kg)   SpO2 98%   BMI 26.92 kg/m  Body mass index is 26.92 kg/m. Physical Exam Vitals and nursing note reviewed.  Constitutional:      Appearance: Normal appearance. He is well-developed.  HENT:     Head: Normocephalic and atraumatic.     Right Ear: Tympanic membrane and external ear normal.     Left Ear: Tympanic membrane and external ear normal.     Nose:  Nose normal.     Mouth/Throat:     Mouth: Mucous membranes are moist.     Pharynx: Oropharynx is clear.  Eyes:     Conjunctiva/sclera: Conjunctivae normal.  Cardiovascular:     Rate and Rhythm: Normal rate and regular rhythm.     Heart sounds: Normal heart sounds. No murmur heard.  No friction rub.  No gallop.   Pulmonary:     Effort: Pulmonary effort is normal.     Breath sounds: Normal breath sounds. No wheezing, rhonchi or rales.  Musculoskeletal:     Cervical back: Neck supple.  Skin:    General: Skin is warm.     Findings: No rash.  Neurological:     Mental Status: He is alert and oriented to person, place, and time.  Psychiatric:        Behavior: Behavior normal.    The plan was reviewed with the patient/family, and all questions/concerned were addressed.  It was my pleasure to see Russell Benton today and participate in his care. Please feel free to contact me with any questions or concerns.  Sincerely,  Rexene Alberts, DO Allergy & Immunology  [facility] office: [phone] [facility] office: [place] office: [phone]

## 2020-06-11 ENCOUNTER — Other Ambulatory Visit: Payer: Self-pay

## 2020-06-11 ENCOUNTER — Ambulatory Visit: Payer: 59 | Admitting: Allergy

## 2020-06-11 ENCOUNTER — Encounter: Payer: Self-pay | Admitting: Allergy

## 2020-06-11 VITALS — BP 118/72 | HR 68 | Temp 97.8°F | Resp 17 | Ht 72.0 in | Wt 198.5 lb

## 2020-06-11 DIAGNOSIS — J339 Nasal polyp, unspecified: Secondary | ICD-10-CM

## 2020-06-11 DIAGNOSIS — J302 Other seasonal allergic rhinitis: Secondary | ICD-10-CM | POA: Insufficient documentation

## 2020-06-11 DIAGNOSIS — J45909 Unspecified asthma, uncomplicated: Secondary | ICD-10-CM

## 2020-06-11 DIAGNOSIS — R430 Anosmia: Secondary | ICD-10-CM

## 2020-06-11 DIAGNOSIS — R12 Heartburn: Secondary | ICD-10-CM | POA: Diagnosis not present

## 2020-06-11 DIAGNOSIS — J3089 Other allergic rhinitis: Secondary | ICD-10-CM | POA: Insufficient documentation

## 2020-06-11 DIAGNOSIS — J4540 Moderate persistent asthma, uncomplicated: Secondary | ICD-10-CM

## 2020-06-11 MED ORDER — XHANCE 93 MCG/ACT NA EXHU: 2.0000 | INHALANT_SUSPENSION | Freq: Two times a day (BID) | NASAL | 5 refills | Status: DC

## 2020-06-11 MED ORDER — LEVALBUTEROL TARTRATE 45 MCG/ACT IN AERO: INHALATION_SPRAY | RESPIRATORY_TRACT | 3 refills | Status: DC

## 2020-06-11 NOTE — Patient Instructions (Addendum)
Asthma: Daily controller medication(s): start using Advair 114mcg 1 puff twice a day and rinse mouth afterwards. Spacer given and demonstrated proper use with inhaler. Patient understood technique and all questions/concerned were addressed.  Continue with montelukast 10mg  daily at night. Cautioned that in some children/adults can experience behavioral changes including hyperactivity, agitation, depression, sleep disturbances and suicidal ideations. These side effects are rare, but if you notice them you should notify me and discontinue Singulair (montelukast). May use levoalbuterol rescue inhaler 2 puffs every 4 to 6 hours as needed for shortness of breath, chest tightness, coughing, and wheezing. Monitor frequency of use.  Asthma control goals:  Full participation in all desired activities (may need albuterol before activity) Albuterol use two times or less a week on average (not counting use with activity) Cough interfering with sleep two times or less a month Oral steroids no more than once a year No hospitalizations  Polyps:  Start Xhance 2 sprays per nostril twice a day. Sample given. Demonstrated proper use.  Stop using all other nasal sprays.  May use nasal saline spray (i.e., Simply Saline) as needed.  Read about Dupixent injections - this medication is approved for polyps and asthma.   Loss of taste and smell:  See if improves with Xhance nasal spray.  If no improvement, we can discuss some therapies for this at next visit.  Pain medications:  Monitor your breathing and sinus symptoms after taking pain medications.  If you notice worsening asthma or sinus symptoms recommend taking Tylenol for pain instead and not taking an NSAIDS (aspirin, ibuprofen, Advil, aleve)  Environmental allergies:  Continue environmental control measures for pollen, pet dander.   Allergy injections would be a good long term options.  Read about allergy injections.   May use over the  counter antihistamines such as Allegra (fexofenadine) daily.  Continue montelukast 10mg  daily.   Heartburn:  Continue lifestyle modifications.   Start taking Prilosec in the morning as prescribed by Dr. Constance Holster - nothing to eat or drink 30 minutes afterwards.  Follow up in 2 months or sooner if needed.  Reducing Pollen Exposure . Pollen seasons: trees (spring), grass (summer) and ragweed/weeds (fall). Marland Kitchen Keep windows closed in your home and car to lower pollen exposure.  Susa Simmonds air conditioning in the bedroom and throughout the house if possible.  . Avoid going out in dry windy days - especially early morning. . Pollen counts are highest between 5 - 10 AM and on dry, hot and windy days.  . Save outside activities for late afternoon or after a heavy rain, when pollen levels are lower.  . Avoid mowing of grass if you have grass pollen allergy. Marland Kitchen Be aware that pollen can also be transported indoors on people and pets.  . Dry your clothes in an automatic dryer rather than hanging them outside where they might collect pollen.  . Rinse hair and eyes before bedtime. Pet Allergen Avoidance: . Contrary to popular opinion, there are no "hypoallergenic" breeds of dogs or cats. That is because people are not allergic to an animal's hair, but to an allergen found in the animal's saliva, dander (dead skin flakes) or urine. Pet allergy symptoms typically occur within minutes. For some people, symptoms can build up and become most severe 8 to 12 hours after contact with the animal. People with severe allergies can experience reactions in public places if dander has been transported on the pet owners' clothing. Marland Kitchen Keeping an animal outdoors is only a partial solution, since  homes with pets in the yard still have higher concentrations of animal allergens. . Before getting a pet, ask your allergist to determine if you are allergic to animals. If your pet is already considered part of your family, try to  minimize contact and keep the pet out of the bedroom and other rooms where you spend a great deal of time. . As with dust mites, vacuum carpets often or replace carpet with a hardwood floor, tile or linoleum. . High-efficiency particulate air (HEPA) cleaners can reduce allergen levels over time. . While dander and saliva are the source of cat and dog allergens, urine is the source of allergens from rabbits, hamsters, mice and [place] pigs; so ask a non-allergic family member to clean the animal's cage. . If you have a pet allergy, talk to your allergist about the potential for allergy immunotherapy (allergy shots). This strategy can often provide long-term relief.    Heartburn Heartburn is a type of pain or discomfort that can happen in the throat or chest. It is often described as a burning pain. It may also cause a bad, acid-like taste in the mouth. Heartburn may feel worse when you lie down or bend over. It may be worse at night. It may be caused by stomach contents that move back up (reflux) into the tube that connects the mouth with the stomach (esophagus). Follow these instructions at home: Eating and drinking   Avoid certain foods and drinks as told by your doctor. This may include: ? Coffee and tea (with or without caffeine). ? Drinks that have alcohol. ? Energy drinks and sports drinks. ? Carbonated drinks or sodas. ? Chocolate and cocoa. ? Peppermint and mint flavorings. ? Garlic and onions. ? Horseradish. ? Spicy and acidic foods, such as:  Peppers.  Chili powder and curry powder.  Vinegar.  Hot sauces and BBQ sauce. ? Citrus fruit juices and citrus fruits, such as:  Oranges.  Lemons.  Limes. ? Tomato-based foods, such as:  Red sauce and pizza with red sauce.  Chili.  Salsa. ? Fried and fatty foods, such as:  Donuts.  [place] fries and potato chips.  High-fat dressings. ? High-fat meats, such as:  Hot dogs and sausage.  Rib eye steak.  Ham and  bacon. ? High-fat dairy items, such as:  Whole milk.  Butter.  Cream cheese.  Eat small meals often. Avoid eating large meals.  Avoid drinking large amounts of liquid with your meals.  Avoid eating meals during the 2-3 hours before bedtime.  Avoid lying down right after you eat.  Do not exercise right after you eat. Lifestyle      If you are overweight, lose an amount of weight that is healthy for you. Ask your doctor about a safe weight loss goal.  Do not use any products that contain nicotine or tobacco, including cigarettes, e-cigarettes, and chewing tobacco. These can make your symptoms worse. If you need help quitting, ask your doctor.  Wear loose clothes. Do not wear anything tight around your waist.  Raise (elevate) the head of your bed about 6 inches (15 cm) when you sleep.  Try to lower your stress. If you need help doing this, ask your doctor. General instructions  Pay attention to any changes in your symptoms.  Take over-the-counter and prescription medicines only as told by your doctor. ? Do not take aspirin, ibuprofen, or other NSAIDs unless your doctor says it is okay. ? Stop medicines only as told by your doctor.  Keep all follow-up visits as told by your doctor. This is important. Contact a doctor if:  You have new symptoms.  You lose weight and you do not know why it is happening.  You have trouble swallowing, or it hurts to swallow.  You have wheezing or a cough that keeps happening.  Your symptoms do not get better with treatment.  You have heartburn often for more than 2 weeks. Get help right away if:  You have pain in your arms, neck, jaw, teeth, or back.  You feel sweaty, dizzy, or light-headed.  You have chest pain or shortness of breath.  You throw up (vomit) and your throw up looks like blood or coffee grounds.  Your poop (stool) is bloody or black. These symptoms may represent a serious problem that is an emergency. Do not  wait to see if the symptoms will go away. Get medical help right away. Call your local emergency services (911 in the [place] Do not drive yourself to the hospital. Summary  Heartburn is a type of pain that can happen in the throat or chest. It can feel like a burning pain. It may also cause a bad, acid-like taste in the mouth.  You may need to avoid certain foods and drinks to help your symptoms. Ask your doctor what foods and drinks you should avoid.  Take over-the-counter and prescription medicines only as told by your doctor. Do not take aspirin, ibuprofen, or other NSAIDs unless your doctor told you to do so.  Contact your doctor if your symptoms do not get better or they get worse. This information is not intended to replace advice given to you by your health care provider. Make sure you discuss any questions you have with your health care provider. Document Revised: 03/26/2018 Document Reviewed: 03/26/2018 Elsevier Patient Education  [place].

## 2020-06-11 NOTE — Assessment & Plan Note (Addendum)
Patient diagnosed with asthma as a child and only needed to use Advair 115mcg 1 puff twice a day during the spring and fall seasons however since May he has been having issues with daily coughing which is worse in the mornings. Initially thought it was from the anesthesia but now persisting. He is not using albuterol during these episodes as he has been doing some type of breathing exercises. The albuterol makes him feel unusual. Normal CXR.  Today's spirometry showed: some restriction with 18% improvement in FEV1 post bronchodilator treatment.  Tolerated xopenex better than albuterol and will send in an Rx for xopenex. Daily controller medication(s): start using Advair 172mcg 1 puff twice a day and rinse mouth afterwards everyday. Continue with montelukast 10mg  daily at night. Cautioned that in some children/adults can experience behavioral changes including hyperactivity, agitation, depression, sleep disturbances and suicidal ideations. These side effects are rare, but if you notice them you should notify me and discontinue Singulair (montelukast). May use levoalbuterol rescue inhaler 2 puffs every 4 to 6 hours as needed for shortness of breath, chest tightness, coughing, and wheezing. Monitor frequency of use.   Spacer given and demonstrated proper use with inhaler. Patient understood technique and all questions/concerned were addressed.  Repeat spirometry at next visit.

## 2020-06-11 NOTE — Assessment & Plan Note (Signed)
Heartburn symptoms improved since cutting down on alcohol and caffeine. ENT prescribed Prilosec but has not started it yet.  Continue lifestyle modifications.   Start taking Prilosec in the morning as prescribed by Dr. Constance Holster - nothing to eat or drink 30 minutes afterwards.

## 2020-06-11 NOTE — Assessment & Plan Note (Addendum)
ENT noted polyps on the right side status post surgery via rhinoscopy. Still having anosmia.  Start Xhance 2 sprays per nostril twice a day. Sample given. Demonstrated proper use.  Stop using all other nasal sprays.  May use nasal saline spray (i.e., Simply Saline) as needed.  Read about Dupixent injections - this medication is approved for polyps and asthma which may be ideal for this patient as he has both.

## 2020-06-11 NOTE — Assessment & Plan Note (Signed)
Perennial rhinitis symptoms for 10+ years which flares in the spring and fall. The last few years noticed worsening symptoms. Skin testing in March 2021 by outside allergist was positive to trees, grass, weed, mold, dog, cat, dust mites, cockroach, feathers. Not interested in allergy injections due to time commitment. Evaluated by ENT and CT sinus showed pansinusitis - no improvement with clindamycin. Underwent sinus surgery on 03/09/2020 and since then had anosmia, ageusia, fatigue, coughing. He received his second Moderna vaccine 8 days before surgery. Had negative covid-19 testing x 2. Nettipot worsens symptoms. Currently on Allegra, Singulair and Flonase with minimal benefit.  Continue environmental control measures.   Allergy immunotherapy is most likely a good long term options.  May use over the counter antihistamines such as Allegra (fexofenadine) daily.  Continue montelukast 10mg  daily.

## 2020-06-11 NOTE — Assessment & Plan Note (Signed)
   See assessment and plan as above.  If no improvement with Xhance then will discuss smell retraining therapy next.

## 2020-06-15 ENCOUNTER — Telehealth: Payer: Self-pay | Admitting: Allergy

## 2020-06-15 NOTE — Telephone Encounter (Signed)
Will await further information request from insurance company. Thank you for you assistance.

## 2020-06-15 NOTE — Telephone Encounter (Signed)
PT called back to find out costs and why he cannot pick up xhance from local pharmacy. Advise this medication is only distributed through [employer]. Per Lonn Georgia, PA was denied so out of pocket is $50/bottle, first bottle is free. Rx is written for 55ml (2 bottles). Per Lonn Georgia, Pt can request appeal with [facility]. Pt will call the insurance company.

## 2020-06-15 NOTE — Telephone Encounter (Signed)
Prior auth for Russell Benton has been submitted via covermymeds.

## 2020-06-15 NOTE — Telephone Encounter (Signed)
[facility] called and [facility] his [facility] but been getting called from [facility] on the xhance and would like for some one to call and tell him why. [phone].

## 2020-06-16 NOTE — Telephone Encounter (Signed)
Patient called back and wanted to know why [facility] kept calling him. He thought it was a scam. I told him it was not, that they were trying to collect information to ship the medication. He said he will call them back.

## 2020-06-16 NOTE — Telephone Encounter (Signed)
Awesome! Thank you!

## 2020-08-10 NOTE — Progress Notes (Signed)
Follow Up Note  RE: Russell Benton MRN: [id number] DOB: 08-Oct-1966 Date of Office Visit: 08/11/2020  Referring provider: Kelton Pillar, MD Primary care provider: Kelton Pillar, MD  Chief Complaint: Asthma, Allergies, and no taste or smell  History of Present Illness: I had the pleasure of seeing Russell Benton for a follow up visit at the [facility] on 08/11/2020. He is a 54 y.o. male, who is being followed for allergic rhinitis, nasal polyps, anosmia, asthma, heartburn. His previous allergy office visit was on 06/11/2020 with Dr. Maudie Mercury. Today is a regular follow up visit.  Other allergic rhinitis Congestion improved with the Xhance.  Still taking allegra in the morning and Singulair at night.   Nasal polyps/anosmia Currently using Xhance 1 spray twice a day. No nosebleeds.  Patient does not want to do Dupixent injections.  Anosmia is still the same.   Asthma: Currently on Advair 133mcg 1 puff twice a day and doing better but noted his stamina has decreased and not the same as before.  No coughing episodes and did not have to use xopenex at all.   Heartburn Stopped Prilosec as it caused nausea.  Patient noticed some irregular heartbeat for the past few weeks.  Assessment and Plan: Russell Benton is a 54 y.o. male with: Moderate persistent asthma without complication Past history - Patient diagnosed with asthma as a child and only needed to use Advair 133mcg 1 puff twice a day during the spring and fall seasons however since May he has been having issues with daily coughing which is worse in the mornings. Initially thought it was from the anesthesia but now persisting. He is not using albuterol during these episodes as he has been doing some type of breathing exercises. The albuterol makes him feel unusual. Normal CXR. 2021 spirometry showed: some restriction with 18% improvement in FEV1 post bronchodilator treatment. Interim history - coughing has improved  with Advair but noticing some heart beat skipping the last few weeks. No flare in symptoms after using NSAIDs.  Today's spirometry showed some mild restriction - improved form last visit. Daily controller medication(s): stop ADVAIR as I'm concerned if the LABA component is causing the heart beat skip issues.  Start Alvesco 117mcg 1 puff twice a day with spacer and rinse mouth afterwards. This is a prodrug and should not have any systemic side effects.   Sample given.   If your heart still skips a beat then please follow up with your PCP.   If breathing is not as well controlled will need to step up therapy.  Continue with montelukast 10mg  daily at night. May use levoalbuterol rescue inhaler 2 puffs every 4 to 6 hours as needed for shortness of breath, chest tightness, coughing, and wheezing. Monitor frequency of use.  Repeat spirometry at next visit.   Other allergic rhinitis Past history - Perennial rhinitis symptoms for 10+ years which flares in the spring and fall. The last few years noticed worsening symptoms. Skin testing in March 2021 by outside allergist was positive to trees, grass, weed, mold, dog, cat, dust mites, cockroach, feathers. Not interested in allergy injections due to time commitment. Evaluated by ENT and CT sinus showed pansinusitis - no improvement with clindamycin. Underwent sinus surgery on 03/09/2020 and since then had anosmia, ageusia, fatigue, coughing. He received his second Moderna vaccine 8 days before surgery. Had negative covid-19 testing x 2. Nettipot worsens symptoms.  Interim history - initially improved with Xhance but not sure if wants to  continue with Xhance.   Continue environmental control measures for pollen, pet dander.   Allergy injections would be a good long term options.    May use over the counter antihistamines such as Allegra (fexofenadine) daily.  Continue montelukast 10mg  daily.   Nasal polyps Past history - ENT noted polyps on the right side  status post surgery via rhinoscopy. Still having anosmia. Interim history - No improvement with Xhance in anosmia. Declines Dupixent.   Continue Xhance 1-2 sprays per nostril twice a day.   You may switch back to Flonase 1-2 sprays per nostril twice a day.   May use nasal saline spray (i.e., Simply Saline) as needed.  Consider Dupixent injections in the future.   Anosmia  See assessment and plan as above.  No change in symptoms. Concerned if anosmia is from possible past covid-19 infections versus post surgical complication. Requesting antibody testing which was ordered.    Heartburn Past history - Heartburn symptoms improved since cutting down on alcohol and caffeine. ENT prescribed Prilosec but has not started it yet. Interim history - Prilosec caused nausea so stopped.   Continue lifestyle modifications.   Return in about 3 months (around 11/11/2020).  Lab Orders     SARS-CoV-2 Antibodies  Diagnostics: Spirometry:  Tracings reviewed. His effort: Good reproducible efforts. FVC: 3.57L FEV1: 2.96L, 74% predicted FEV1/FVC ratio: 83% Interpretation: Spirometry consistent with possible restrictive disease.  Please see scanned spirometry results for details.  Medication List:  Current Outpatient Medications  Medication Sig Dispense Refill  . diazepam (VALIUM) 5 MG tablet SMARTSIG:1 Tablet(s) By Mouth 1 to 2 Times Daily    . fexofenadine (ALLEGRA) 180 MG tablet Take by mouth.    . Fluticasone Propionate (XHANCE) 93 MCG/ACT EXHU Place 2 sprays into the nose in the morning and at bedtime. 32 mL 5  . Fluticasone-Salmeterol (ADVAIR) 100-50 MCG/DOSE AEPB Inhale 1 puff into the lungs 2 (two) times daily.    Marland Kitchen levalbuterol (XOPENEX HFA) 45 MCG/ACT inhaler Take 2 puffs every 4-6 hours as needed for coughing, wheezing, chest tightness or shortness of breath. Use with a spacer. 1 Inhaler 3  . montelukast (SINGULAIR) 10 MG tablet Take 10 mg by mouth daily.    . sildenafil (VIAGRA) 50 MG  tablet Take 50 mg by mouth as needed.     No current facility-administered medications for this visit.   Allergies: No Known Allergies I reviewed his past medical history, social history, family history, and environmental history and no significant changes have been reported from his previous visit.  Review of Systems  Constitutional: Negative for appetite change, chills, fever and unexpected weight change.  HENT: Positive for congestion. Negative for rhinorrhea.   Eyes: Negative for itching.  Respiratory: Negative for cough, chest tightness, shortness of breath and wheezing.   Cardiovascular: Negative for chest pain.  Gastrointestinal: Negative for abdominal pain.  Genitourinary: Negative for difficulty urinating.  Skin: Negative for rash.  Allergic/Immunologic: Positive for environmental allergies.  Neurological: Negative for headaches.   Objective: BP 110/70   Pulse 82   Temp (!) 97.3 F (36.3 C) (Temporal)   Resp 18   Ht 6' (1.829 m)   Wt 198 lb (89.8 kg)   SpO2 96%   BMI 26.85 kg/m  Body mass index is 26.85 kg/m. Physical Exam Vitals and nursing note reviewed.  Constitutional:      Appearance: Normal appearance. He is well-developed.  HENT:     Head: Normocephalic and atraumatic.     Right Ear: Tympanic  membrane and external ear normal.     Left Ear: Tympanic membrane and external ear normal.     Nose: Nose normal.     Mouth/Throat:     Mouth: Mucous membranes are moist.     Pharynx: Oropharynx is clear.  Eyes:     Conjunctiva/sclera: Conjunctivae normal.  Cardiovascular:     Rate and Rhythm: Normal rate and regular rhythm.     Heart sounds: Normal heart sounds. No murmur heard.  No friction rub. No gallop.   Pulmonary:     Effort: Pulmonary effort is normal.     Breath sounds: Normal breath sounds. No wheezing, rhonchi or rales.  Musculoskeletal:     Cervical back: Neck supple.  Skin:    General: Skin is warm.     Findings: No rash.  Neurological:      Mental Status: He is alert and oriented to person, place, and time.  Psychiatric:        Behavior: Behavior normal.    Previous notes and tests were reviewed. The plan was reviewed with the patient/family, and all questions/concerned were addressed.  It was my pleasure to see Russell Benton today and participate in his care. Please feel free to contact me with any questions or concerns.  Sincerely,  Rexene Alberts, DO Allergy & Immunology  [facility] office: [place] office: [phone]

## 2020-08-11 ENCOUNTER — Ambulatory Visit: Payer: 59 | Admitting: Allergy

## 2020-08-11 ENCOUNTER — Encounter: Payer: Self-pay | Admitting: Allergy

## 2020-08-11 ENCOUNTER — Other Ambulatory Visit: Payer: Self-pay

## 2020-08-11 VITALS — BP 110/70 | HR 82 | Temp 97.3°F | Resp 18 | Ht 72.0 in | Wt 198.0 lb

## 2020-08-11 DIAGNOSIS — J339 Nasal polyp, unspecified: Secondary | ICD-10-CM

## 2020-08-11 DIAGNOSIS — J4540 Moderate persistent asthma, uncomplicated: Secondary | ICD-10-CM | POA: Diagnosis not present

## 2020-08-11 DIAGNOSIS — R430 Anosmia: Secondary | ICD-10-CM

## 2020-08-11 DIAGNOSIS — R12 Heartburn: Secondary | ICD-10-CM

## 2020-08-11 DIAGNOSIS — J3089 Other allergic rhinitis: Secondary | ICD-10-CM | POA: Diagnosis not present

## 2020-08-11 NOTE — Assessment & Plan Note (Signed)
Past history - Heartburn symptoms improved since cutting down on alcohol and caffeine. ENT prescribed Prilosec but has not started it yet. Interim history - Prilosec caused nausea so stopped.   Continue lifestyle modifications.

## 2020-08-11 NOTE — Assessment & Plan Note (Addendum)
Past history - Patient diagnosed with asthma as a child and only needed to use Advair 158mcg 1 puff twice a day during the spring and fall seasons however since May he has been having issues with daily coughing which is worse in the mornings. Initially thought it was from the anesthesia but now persisting. He is not using albuterol during these episodes as he has been doing some type of breathing exercises. The albuterol makes him feel unusual. Normal CXR. 2021 spirometry showed: some restriction with 18% improvement in FEV1 post bronchodilator treatment. Interim history - coughing has improved with Advair but noticing some heart beat skipping the last few weeks. No flare in symptoms after using NSAIDs.  Today's spirometry showed some mild restriction - improved form last visit. Daily controller medication(s): stop ADVAIR as I'm concerned if the LABA component is causing the heart beat skip issues.  Start Alvesco 111mcg 1 puff twice a day with spacer and rinse mouth afterwards. This is a prodrug and should not have any systemic side effects.   Sample given.   If your heart still skips a beat then please follow up with your PCP.   If breathing is not as well controlled will need to step up therapy.  Continue with montelukast 10mg  daily at night. May use levoalbuterol rescue inhaler 2 puffs every 4 to 6 hours as needed for shortness of breath, chest tightness, coughing, and wheezing. Monitor frequency of use.  Repeat spirometry at next visit.

## 2020-08-11 NOTE — Assessment & Plan Note (Signed)
   See assessment and plan as above.  No change in symptoms. Concerned if anosmia is from possible past covid-19 infections versus post surgical complication. Requesting antibody testing which was ordered.

## 2020-08-11 NOTE — Assessment & Plan Note (Signed)
Past history - Perennial rhinitis symptoms for 10+ years which flares in the spring and fall. The last few years noticed worsening symptoms. Skin testing in March 2021 by outside allergist was positive to trees, grass, weed, mold, dog, cat, dust mites, cockroach, feathers. Not interested in allergy injections due to time commitment. Evaluated by ENT and CT sinus showed pansinusitis - no improvement with clindamycin. Underwent sinus surgery on 03/09/2020 and since then had anosmia, ageusia, fatigue, coughing. He received his second Moderna vaccine 8 days before surgery. Had negative covid-19 testing x 2. Nettipot worsens symptoms.  Interim history - initially improved with Xhance but not sure if wants to continue with Xhance.   Continue environmental control measures for pollen, pet dander.   Allergy injections would be a good long term options.    May use over the counter antihistamines such as Allegra (fexofenadine) daily.  Continue montelukast 10mg  daily.

## 2020-08-11 NOTE — Patient Instructions (Addendum)
Asthma: Daily controller medication(s): stop ADVAIR Start Alvesco 170mcg 1 puff twice a day with spacer and rinse mouth afterwards.  Sample given.   If your heart still skips a beat then please follow up with your PCP.  Continue with montelukast 10mg  daily at night. May use levoalbuterol rescue inhaler 2 puffs every 4 to 6 hours as needed for shortness of breath, chest tightness, coughing, and wheezing. Monitor frequency of use.  Asthma control goals:  Full participation in all desired activities (may need albuterol before activity) Albuterol use two times or less a week on average (not counting use with activity) Cough interfering with sleep two times or less a month Oral steroids no more than once a year No hospitalizations  Polyps/loss of taste and smell:   Continue Xhance 1-2 sprays per nostril twice a day.   You may switch back to Flonase 1-2 sprays per nostril twice a day.   May use nasal saline spray (i.e., Simply Saline) as needed.  Consider Dupixent injections in the future.   Environmental allergies:  Continue environmental control measures for pollen, pet dander.   Allergy injections would be a good long term options.    May use over the counter antihistamines such as Allegra (fexofenadine) daily.  Continue montelukast 10mg  daily.   Heartburn:  Continue lifestyle modifications.   Follow up in 3 months or sooner if needed.  Reducing Pollen Exposure . Pollen seasons: trees (spring), grass (summer) and ragweed/weeds (fall). Marland Kitchen Keep windows closed in your home and car to lower pollen exposure.  Susa Simmonds air conditioning in the bedroom and throughout the house if possible.  . Avoid going out in dry windy days - especially early morning. . Pollen counts are highest between 5 - 10 AM and on dry, hot and windy days.  . Save outside activities for late afternoon or after a heavy rain, when pollen levels are lower.  . Avoid mowing of grass if you have grass pollen  allergy. Marland Kitchen Be aware that pollen can also be transported indoors on people and pets.  . Dry your clothes in an automatic dryer rather than hanging them outside where they might collect pollen.  . Rinse hair and eyes before bedtime. Pet Allergen Avoidance: . Contrary to popular opinion, there are no "hypoallergenic" breeds of dogs or cats. That is because people are not allergic to an animal's hair, but to an allergen found in the animal's saliva, dander (dead skin flakes) or urine. Pet allergy symptoms typically occur within minutes. For some people, symptoms can build up and become most severe 8 to 12 hours after contact with the animal. People with severe allergies can experience reactions in public places if dander has been transported on the pet owners' clothing. Marland Kitchen Keeping an animal outdoors is only a partial solution, since homes with pets in the yard still have higher concentrations of animal allergens. . Before getting a pet, ask your allergist to determine if you are allergic to animals. If your pet is already considered part of your family, try to minimize contact and keep the pet out of the bedroom and other rooms where you spend a great deal of time. . As with dust mites, vacuum carpets often or replace carpet with a hardwood floor, tile or linoleum. . High-efficiency particulate air (HEPA) cleaners can reduce allergen levels over time. . While dander and saliva are the source of cat and dog allergens, urine is the source of allergens from rabbits, hamsters, mice and [place] pigs; so  ask a non-allergic family member to clean the animal's cage. . If you have a pet allergy, talk to your allergist about the potential for allergy immunotherapy (allergy shots). This strategy can often provide long-term relief.    Heartburn Heartburn is a type of pain or discomfort that can happen in the throat or chest. It is often described as a burning pain. It may also cause a bad, acid-like taste in the  mouth. Heartburn may feel worse when you lie down or bend over. It may be worse at night. It may be caused by stomach contents that move back up (reflux) into the tube that connects the mouth with the stomach (esophagus). Follow these instructions at home: Eating and drinking   Avoid certain foods and drinks as told by your doctor. This may include: ? Coffee and tea (with or without caffeine). ? Drinks that have alcohol. ? Energy drinks and sports drinks. ? Carbonated drinks or sodas. ? Chocolate and cocoa. ? Peppermint and mint flavorings. ? Garlic and onions. ? Horseradish. ? Spicy and acidic foods, such as:  Peppers.  Chili powder and curry powder.  Vinegar.  Hot sauces and BBQ sauce. ? Citrus fruit juices and citrus fruits, such as:  Oranges.  Lemons.  Limes. ? Tomato-based foods, such as:  Red sauce and pizza with red sauce.  Chili.  Salsa. ? Fried and fatty foods, such as:  Donuts.  [place] fries and potato chips.  High-fat dressings. ? High-fat meats, such as:  Hot dogs and sausage.  Rib eye steak.  Ham and bacon. ? High-fat dairy items, such as:  Whole milk.  Butter.  Cream cheese.  Eat small meals often. Avoid eating large meals.  Avoid drinking large amounts of liquid with your meals.  Avoid eating meals during the 2-3 hours before bedtime.  Avoid lying down right after you eat.  Do not exercise right after you eat. Lifestyle      If you are overweight, lose an amount of weight that is healthy for you. Ask your doctor about a safe weight loss goal.  Do not use any products that contain nicotine or tobacco, including cigarettes, e-cigarettes, and chewing tobacco. These can make your symptoms worse. If you need help quitting, ask your doctor.  Wear loose clothes. Do not wear anything tight around your waist.  Raise (elevate) the head of your bed about 6 inches (15 cm) when you sleep.  Try to lower your stress. If you need help  doing this, ask your doctor. General instructions  Pay attention to any changes in your symptoms.  Take over-the-counter and prescription medicines only as told by your doctor. ? Do not take aspirin, ibuprofen, or other NSAIDs unless your doctor says it is okay. ? Stop medicines only as told by your doctor.  Keep all follow-up visits as told by your doctor. This is important. Contact a doctor if:  You have new symptoms.  You lose weight and you do not know why it is happening.  You have trouble swallowing, or it hurts to swallow.  You have wheezing or a cough that keeps happening.  Your symptoms do not get better with treatment.  You have heartburn often for more than 2 weeks. Get help right away if:  You have pain in your arms, neck, jaw, teeth, or back.  You feel sweaty, dizzy, or light-headed.  You have chest pain or shortness of breath.  You throw up (vomit) and your throw up looks like blood or  coffee grounds.  Your poop (stool) is bloody or black. These symptoms may represent a serious problem that is an emergency. Do not wait to see if the symptoms will go away. Get medical help right away. Call your local emergency services (911 in the [place] Do not drive yourself to the hospital. Summary  Heartburn is a type of pain that can happen in the throat or chest. It can feel like a burning pain. It may also cause a bad, acid-like taste in the mouth.  You may need to avoid certain foods and drinks to help your symptoms. Ask your doctor what foods and drinks you should avoid.  Take over-the-counter and prescription medicines only as told by your doctor. Do not take aspirin, ibuprofen, or other NSAIDs unless your doctor told you to do so.  Contact your doctor if your symptoms do not get better or they get worse. This information is not intended to replace advice given to you by your health care provider. Make sure you discuss any questions you have with your health care  provider. Document Revised: 03/26/2018 Document Reviewed: 03/26/2018 Elsevier Patient Education  [place].

## 2020-08-11 NOTE — Assessment & Plan Note (Signed)
Past history - ENT noted polyps on the right side status post surgery via rhinoscopy. Still having anosmia. Interim history - No improvement with Xhance in anosmia. Declines Dupixent.   Continue Xhance 1-2 sprays per nostril twice a day.   You may switch back to Flonase 1-2 sprays per nostril twice a day.   May use nasal saline spray (i.e., Simply Saline) as needed.  Consider Dupixent injections in the future.

## 2020-08-14 LAB — SARS-COV-2 ANTIBODIES: SARS-CoV-2 Antibodies: NEGATIVE

## 2020-11-11 NOTE — Progress Notes (Signed)
Follow Up Note  RE: Russell Benton MRN: [id number] DOB: 09/19/1966 Date of Office Visit: 11/12/2020  Referring provider: Kelton Pillar, MD Primary care provider: Kelton Pillar, MD  Chief Complaint: Asthma  History of Present Illness: I had the pleasure of seeing Russell Benton for a follow up visit at the [facility] on 11/12/2020. He is a 55 y.o. male, who is being followed for asthma, allergic rhinitis, nasal polyps, anosmia, heartburn. His previous allergy office visit was on 08/11/2020 with Dr. Maudie Mercury. Today is a regular follow up visit.  Moderate persistent asthma ACT score 23.  Patient tried Alvesco for about 2 weeks but it was not as effective as the Advair. Currently on Advair 112mcg 1 puff in the morning and using Alvesco 153mcg 1 puff at night if needed about 1-2 times per month for chest tightness. This seems to help and is not having the issues with heart beats skipping. No albuterol use. Otherwise denies any SOB, coughing, wheezing, nocturnal awakenings, ER/urgent care visits or prednisone use since the last visit.  Allergic rhinitis Not taking allegra or Singulair currently as he has minimal symptoms during the winter months.   Nasal polyps Currently doing Xhance 1 spray per nostril BID due to epistaxis episodes. Saw ENT - left side was clear, right side 95% was clear.  Still having issues with anosmia but at times he thinks he can smell something.  He has tried some at home smell training with no success.   Heartburn Improved with decreased alcohol intake.   10/20/2020 ENT visit: "Return visit. Over the past several weeks he has noticed occasional times where he thinks he can smell something a little bit. Otherwise he is doing very well with his nasal breathing and his asthma. He is using XHANCE nasal spray 1 puff on each side twice daily. On flexible fiberoptic examination of the nose and sinuses the left nasal cavity ethmoid frontal  maxillary are all completely clear and healthy without polyps or exudate. On the right side, very similar except in the right frontal duct area there is a small amount of polypoid swelling and mucus but no pus or granulation tissue. I think all of this is very encouraging. His sounds as though he may be starting to regain some olfactory function and hopefully that will continue. Recommend he cut down on the nasal spray to once daily on the left but continue twice daily on the right and repeat examination in 3 months."  Assessment and Plan: Russell Benton is a 55 y.o. male with: Moderate persistent asthma without complication Past history - Patient diagnosed with asthma as a child and only needed to use Advair 138mcg 1 puff twice a day during the spring and fall seasons however since May he has been having issues with daily coughing which is worse in the mornings. Initially thought it was from the anesthesia but now persisting. He is not using albuterol during these episodes as he has been doing some type of breathing exercises. The albuterol makes him feel unusual. Normal CXR. 2021 spirometry showed: some restriction with 18% improvement in FEV1 post bronchodilator treatment. Interim history - Alvesco alone was not effective, using Advair once a day seems to help without having the heart skipping issues.   Today's spirometry showed some mild restriction - slightly decreased from last visit.   ACT score 23.  Daily controller medication(s): continue Advair 160mcg 1 puff daily in the morning and rinse mouth after each use.  May use  Alvesco 181mcg 1 puff at night a day and rinse mouth afterwards.  If you notice more frequent symptoms at night, use daily at night.  May use levoalbuterol rescue inhaler 2 puffs every 4 to 6 hours as needed for shortness of breath, chest tightness, coughing, and wheezing. Monitor frequency of use.  Repeat spirometry at next visit.   Nasal polyps Past history - ENT noted polyps on  the right side status post surgery via rhinoscopy. Still having anosmia. Interim history - No improvement with Xhance in anosmia but polyps improved.   Continue Xhance 1-2 sprays per nostril twice a day.   May use nasal saline spray (i.e., Simply Saline) as needed.  Consider Dupixent injections - every 2 weeks (can do it at home or in the office). Recommend a few months trial to see if he has any improvement in anosmia.   This may also help with his asthma symptoms.   Tammy, our biologics coordinator will be in touch with regarding the cost and coverage.   Anosmia  See assessment and plan as above.  Seasonal and perennial allergic rhinitis Past history - Perennial rhinitis symptoms for 10+ years which flares in the spring and fall. The last few years noticed worsening symptoms. Skin testing in March 2021 by outside allergist was positive to trees, grass, weed, mold, dog, cat, dust mites, cockroach, feathers. Evaluated by ENT and CT sinus showed pansinusitis - no improvement with clindamycin. Underwent sinus surgery on 03/09/2020 and since then had anosmia, ageusia, fatigue, coughing. He received his second Moderna vaccine 8 days before surgery. Had negative covid-19 testing x 2. Nettipot worsens symptoms.  Interim history - not taking any medications currently as symptoms are minimal.   Continue environmental control measures.  Allergy injections would be a good long term option - handout given.   Had a detailed discussion with patient/family that clinical history is suggestive of allergic rhinitis, and may benefit from allergy immunotherapy (AIT). Discussed in detail regarding the dosing, schedule, side effects (mild to moderate local allergic reaction and rarely systemic allergic reactions including anaphylaxis), and benefits (significant improvement in nasal symptoms, seasonal flares of asthma) of immunotherapy with the patient. There is significant time commitment involved with allergy  shots, which includes weekly immunotherapy injections for first 9-12 months and then biweekly to monthly injections for 3-5 years.   Check with your insurance regarding cost.   May use over the counter antihistamines such as Allegra (fexofenadine) daily.  Restart montelukast 10mg  daily in the spring.   Heartburn Past history - Heartburn symptoms improved since cutting down on alcohol and caffeine. ENT prescribed Prilosec but has not started it yet. PPI caused nausea.   Continue lifestyle modifications.   Return in about 3 months (around 02/10/2021).  Diagnostics: Spirometry:  Tracings reviewed. His effort: Good reproducible efforts. FVC: 3.41L FEV1: 2.85L, 76% predicted FEV1/FVC ratio: 84% Interpretation: Spirometry consistent with possible restrictive disease.  Please see scanned spirometry results for details.  Medication List:  Current Outpatient Medications  Medication Sig Dispense Refill  . diazepam (VALIUM) 5 MG tablet SMARTSIG:1 Tablet(s) By Mouth 1 to 2 Times Daily    . fexofenadine (ALLEGRA) 180 MG tablet Take by mouth.    . Fluticasone Propionate (XHANCE) 93 MCG/ACT EXHU Place 2 sprays into the nose in the morning and at bedtime. 32 mL 5  . Fluticasone-Salmeterol (ADVAIR) 100-50 MCG/DOSE AEPB Inhale 1 puff into the lungs 2 (two) times daily.    Marland Kitchen levalbuterol (XOPENEX HFA) 45 MCG/ACT inhaler Take 2  puffs every 4-6 hours as needed for coughing, wheezing, chest tightness or shortness of breath. Use with a spacer. 1 Inhaler 3  . montelukast (SINGULAIR) 10 MG tablet Take 10 mg by mouth daily.    . sildenafil (VIAGRA) 50 MG tablet Take 50 mg by mouth as needed.     No current facility-administered medications for this visit.   Allergies: No Known Allergies I reviewed his past medical history, social history, family history, and environmental history and no significant changes have been reported from his previous visit.  Review of Systems  Constitutional: Negative for  appetite change, chills, fever and unexpected weight change.  HENT: Positive for congestion. Negative for rhinorrhea.   Eyes: Negative for itching.  Respiratory: Negative for cough, chest tightness, shortness of breath and wheezing.   Cardiovascular: Negative for chest pain.  Gastrointestinal: Negative for abdominal pain.  Genitourinary: Negative for difficulty urinating.  Skin: Negative for rash.  Allergic/Immunologic: Positive for environmental allergies.  Neurological: Negative for headaches.   Objective: BP 108/80 (BP Location: Left Arm, Patient Position: Sitting, Cuff Size: Normal)   Pulse 67   Temp (!) 96.3 F (35.7 C) (Temporal)   Resp 18   Ht 6' 0.44" (1.84 m)   Wt 197 lb (89.4 kg)   SpO2 97%   BMI 26.39 kg/m  Body mass index is 26.39 kg/m. Physical Exam Vitals and nursing note reviewed.  Constitutional:      Appearance: Normal appearance. He is well-developed.  HENT:     Head: Normocephalic and atraumatic.     Right Ear: Tympanic membrane and external ear normal.     Left Ear: Tympanic membrane and external ear normal.     Nose: Nose normal.     Mouth/Throat:     Mouth: Mucous membranes are moist.     Pharynx: Oropharynx is clear.  Eyes:     Conjunctiva/sclera: Conjunctivae normal.  Cardiovascular:     Rate and Rhythm: Normal rate and regular rhythm.     Heart sounds: Normal heart sounds. No murmur heard. No friction rub. No gallop.   Pulmonary:     Effort: Pulmonary effort is normal.     Breath sounds: Normal breath sounds. No wheezing, rhonchi or rales.  Musculoskeletal:     Cervical back: Neck supple.  Skin:    General: Skin is warm.     Findings: No rash.  Neurological:     Mental Status: He is alert and oriented to person, place, and time.  Psychiatric:        Behavior: Behavior normal.    Previous notes and tests were reviewed. The plan was reviewed with the patient/family, and all questions/concerned were addressed.  It was my pleasure to see  Russell Benton today and participate in his care. Please feel free to contact me with any questions or concerns.  Sincerely,  Wyline Mood, DO Allergy & Immunology  [facility] office: [phone] [facility] office: [phone]

## 2020-11-12 ENCOUNTER — Encounter: Payer: Self-pay | Admitting: Allergy

## 2020-11-12 ENCOUNTER — Other Ambulatory Visit: Payer: Self-pay

## 2020-11-12 ENCOUNTER — Ambulatory Visit: Payer: 59 | Admitting: Allergy

## 2020-11-12 VITALS — BP 108/80 | HR 67 | Temp 96.3°F | Resp 18 | Ht 72.44 in | Wt 197.0 lb

## 2020-11-12 DIAGNOSIS — J302 Other seasonal allergic rhinitis: Secondary | ICD-10-CM

## 2020-11-12 DIAGNOSIS — R430 Anosmia: Secondary | ICD-10-CM | POA: Diagnosis not present

## 2020-11-12 DIAGNOSIS — J4540 Moderate persistent asthma, uncomplicated: Secondary | ICD-10-CM

## 2020-11-12 DIAGNOSIS — J3089 Other allergic rhinitis: Secondary | ICD-10-CM

## 2020-11-12 DIAGNOSIS — R12 Heartburn: Secondary | ICD-10-CM

## 2020-11-12 DIAGNOSIS — J339 Nasal polyp, unspecified: Secondary | ICD-10-CM

## 2020-11-12 NOTE — Assessment & Plan Note (Signed)
Past history - Perennial rhinitis symptoms for 10+ years which flares in the spring and fall. The last few years noticed worsening symptoms. Skin testing in March 2021 by outside allergist was positive to trees, grass, weed, mold, dog, cat, dust mites, cockroach, feathers. Evaluated by ENT and CT sinus showed pansinusitis - no improvement with clindamycin. Underwent sinus surgery on 03/09/2020 and since then had anosmia, ageusia, fatigue, coughing. He received his second Moderna vaccine 8 days before surgery. Had negative covid-19 testing x 2. Nettipot worsens symptoms.  Interim history - not taking any medications currently as symptoms are minimal.   Continue environmental control measures.  Allergy injections would be a good long term option - handout given.   Had a detailed discussion with patient/family that clinical history is suggestive of allergic rhinitis, and may benefit from allergy immunotherapy (AIT). Discussed in detail regarding the dosing, schedule, side effects (mild to moderate local allergic reaction and rarely systemic allergic reactions including anaphylaxis), and benefits (significant improvement in nasal symptoms, seasonal flares of asthma) of immunotherapy with the patient. There is significant time commitment involved with allergy shots, which includes weekly immunotherapy injections for first 9-12 months and then biweekly to monthly injections for 3-5 years.   Check with your insurance regarding cost.   May use over the counter antihistamines such as Allegra (fexofenadine) daily.  Restart montelukast 10mg  daily in the spring.

## 2020-11-12 NOTE — Assessment & Plan Note (Signed)
Past history - Patient diagnosed with asthma as a child and only needed to use Advair 1 puff twice a day during the spring and fall seasons however since May he has been having issues with daily coughing which is worse in the mornings. Initially thought it was from the anesthesia but now persisting. He is not using albuterol during these episodes as he has been doing some type of breathing exercises. The albuterol makes him feel unusual. Normal CXR. 2021 spirometry showed: some restriction with 18% improvement in FEV1 post bronchodilator treatment. Interim history - Alvesco alone was not effective, using Advair once a day seems to help without having the heart skipping issues.   Today's spirometry showed some mild restriction - slightly decreased from last visit.   ACT score 23.  Daily controller medication(s): continue Advair 1 puff daily in the morning and rinse mouth after each use.  May use Alvesco 1 puff at night a day and rinse mouth afterwards.  If you notice more frequent symptoms at night, use daily at night.  May use levoalbuterol rescue inhaler 2 puffs every 4 to 6 hours as needed for shortness of breath, chest tightness, coughing, and wheezing. Monitor frequency of use.  Repeat spirometry at next visit.

## 2020-11-12 NOTE — Assessment & Plan Note (Signed)
Past history - Heartburn symptoms improved since cutting down on alcohol and caffeine. ENT prescribed Prilosec but has not started it yet. PPI caused nausea.   Continue lifestyle modifications.

## 2020-11-12 NOTE — Assessment & Plan Note (Signed)
.   See assessment and plan as above. 

## 2020-11-12 NOTE — Patient Instructions (Addendum)
Asthma: Daily controller medication(s): continue Advair 1 puff daily in the morning and rinse mouth after each use.  May use Alvesco 1 puff at night a day and rinse mouth afterwards.  If you notice more frequent symptoms at night, use daily at night.  May use levoalbuterol rescue inhaler 2 puffs every 4 to 6 hours as needed for shortness of breath, chest tightness, coughing, and wheezing. Monitor frequency of use.  Asthma control goals:  Full participation in all desired activities (may need albuterol before activity) Albuterol use two times or less a week on average (not counting use with activity) Cough interfering with sleep two times or less a month Oral steroids no more than once a year No hospitalizations  Polyps/loss of taste and smell:   Continue Xhance 1-2 sprays per nostril twice a day.   May use nasal saline spray (i.e., Simply Saline) as needed.  Consider Dupixent injections - every 2 weeks (can do it at home or in the office)  Tammy, our biologics coordinator will be in touch with you regarding the cost and coverage.   Environmental allergies:  Skin testing in March 2021 by outside allergist was positive to trees, grass, weed, mold, dog, cat, dust mites, cockroach, feathers.   Continue environmental control measures.  Allergy injections would be a good long term option - see handout.  Had a detailed discussion with patient/family that clinical history is suggestive of allergic rhinitis, and may benefit from allergy immunotherapy (AIT). Discussed in detail regarding the dosing, schedule, side effects (mild to moderate local allergic reaction and rarely systemic allergic reactions including anaphylaxis), and benefits (significant improvement in nasal symptoms, seasonal flares of asthma) of immunotherapy with the patient. There is significant time commitment involved with allergy shots, which includes weekly immunotherapy injections for first 9-12 months and then  biweekly to monthly injections for 3-5 years.   Check with your insurance regarding cost.   May use over the counter antihistamines such as Allegra (fexofenadine) daily.  Restart montelukast 10mg  daily in the spring.   Heartburn:  Continue lifestyle modifications.   Follow up in 3 months or sooner if needed.  Reducing Pollen Exposure . Pollen seasons: trees (spring), grass (summer) and ragweed/weeds (fall). [birth year]1981 Keep windows closed in your home and car to lower pollen exposure.  Marland Kitchen air conditioning in the bedroom and throughout the house if possible.  . Avoid going out in dry windy days - especially early morning. . Pollen counts are highest between 5 - 10 AM and on dry, hot and windy days.  . Save outside activities for late afternoon or after a heavy rain, when pollen levels are lower.  . Avoid mowing of grass if you have grass pollen allergy. Lilian Kapur Be aware that pollen can also be transported indoors on people and pets.  . Dry your clothes in an automatic dryer rather than hanging them outside where they might collect pollen.  . Rinse hair and eyes before bedtime. Pet Allergen Avoidance: . Contrary to popular opinion, there are no "hypoallergenic" breeds of dogs or cats. That is because people are not allergic to an animal's hair, but to an allergen found in the animal's saliva, dander (dead skin flakes) or urine. Pet allergy symptoms typically occur within minutes. For some people, symptoms can build up and become most severe 8 to 12 hours after contact with the animal. People with severe allergies can experience reactions in public places if dander has been transported on the pet owners' clothing. Marland Kitchen  Keeping an animal outdoors is only a partial solution, since homes with pets in the yard still have higher concentrations of animal allergens. . Before getting a pet, ask your allergist to determine if you are allergic to animals. If your pet is already considered part of your family, try  to minimize contact and keep the pet out of the bedroom and other rooms where you spend a great deal of time. . As with dust mites, vacuum carpets often or replace carpet with a hardwood floor, tile or linoleum. . High-efficiency particulate air (HEPA) cleaners can reduce allergen levels over time. . While dander and saliva are the source of cat and dog allergens, urine is the source of allergens from rabbits, hamsters, mice and [place] pigs; so ask a non-allergic family member to clean the animal's cage. . If you have a pet allergy, talk to your allergist about the potential for allergy immunotherapy (allergy shots). This strategy can often provide long-term relief.

## 2020-11-12 NOTE — Assessment & Plan Note (Addendum)
Past history - ENT noted polyps on the right side status post surgery via rhinoscopy. Still having anosmia. Interim history - No improvement with Xhance in anosmia but polyps improved.   Continue Xhance 1-2 sprays per nostril twice a day.   May use nasal saline spray (i.e., Simply Saline) as needed.  Consider Dupixent injections - every 2 weeks (can do it at home or in the office). Recommend a few months trial to see if he has any improvement in anosmia.   This may also help with his asthma symptoms.   Tammy, our biologics coordinator will be in touch with regarding the cost and coverage.

## 2020-11-13 ENCOUNTER — Telehealth: Payer: Self-pay | Admitting: *Deleted

## 2020-11-13 NOTE — Telephone Encounter (Signed)
Called patient and advised cost with copay card should be $0.  Advised already have approval and he is on board with starting therapy.  Will send to [facility] and instructed patient on delivery, storage and appt for instruction once Rx received

## 2020-11-13 NOTE — Telephone Encounter (Signed)
-----   Message from Garnet Sierras, DO sent at 11/12/2020  9:34 AM EST ----- Amarius Toto, can you start PA for Dupixent for polyps? He also has asthma. He wants to know rough cost estimates before starting. Thank you.

## 2020-11-26 ENCOUNTER — Ambulatory Visit (INDEPENDENT_AMBULATORY_CARE_PROVIDER_SITE_OTHER): Payer: 59

## 2020-11-26 ENCOUNTER — Other Ambulatory Visit: Payer: Self-pay

## 2020-11-26 DIAGNOSIS — J339 Nasal polyp, unspecified: Secondary | ICD-10-CM | POA: Diagnosis not present

## 2020-11-26 MED ORDER — DUPILUMAB 300 MG/2ML ~~LOC~~ SOSY
300.0000 mg | PREFILLED_SYRINGE | SUBCUTANEOUS | Status: AC
  Administered 2020-11-26 – 2021-12-09 (×28): 300 mg via SUBCUTANEOUS

## 2020-11-26 MED ORDER — EPINEPHRINE 0.3 MG/0.3ML IJ SOAJ: 0.3000 mg | INTRAMUSCULAR | 1 refills | Status: AC | PRN

## 2020-11-26 NOTE — Progress Notes (Signed)
Immunotherapy   Patient Details  Name: Russell Benton MRN: [id number] Date of Birth: Mar 22, 1966  11/26/2020  Russell Benton is starting [place] therapy today. He will receive 300 mg in office today as there is no loading dose for the nasal polyp diagnosis.  Frequency: every 2 weeks Epi-Pen:Prescription for Epi-Pen given Consent signed and patient instructions given.   Rosalio Loud 11/26/2020, 8:34 AM

## 2020-12-10 ENCOUNTER — Ambulatory Visit: Payer: Self-pay

## 2020-12-11 ENCOUNTER — Other Ambulatory Visit: Payer: Self-pay

## 2020-12-11 ENCOUNTER — Ambulatory Visit (INDEPENDENT_AMBULATORY_CARE_PROVIDER_SITE_OTHER): Payer: 59 | Admitting: *Deleted

## 2020-12-11 DIAGNOSIS — J339 Nasal polyp, unspecified: Secondary | ICD-10-CM

## 2020-12-24 ENCOUNTER — Ambulatory Visit (INDEPENDENT_AMBULATORY_CARE_PROVIDER_SITE_OTHER): Payer: 59 | Admitting: *Deleted

## 2020-12-24 ENCOUNTER — Other Ambulatory Visit: Payer: Self-pay

## 2020-12-24 DIAGNOSIS — J339 Nasal polyp, unspecified: Secondary | ICD-10-CM | POA: Diagnosis not present

## 2020-12-25 ENCOUNTER — Ambulatory Visit: Payer: Self-pay

## 2020-12-29 ENCOUNTER — Telehealth: Payer: Self-pay | Admitting: Allergy

## 2020-12-29 NOTE — Telephone Encounter (Signed)
Please tell patient to use Xhance 1 spray per nostril once a day for 1 more month and then stop and monitor symptoms.

## 2020-12-29 NOTE — Telephone Encounter (Signed)
Patient is almost out of [place] and is wondering if he should order more and continue to take it with Dupixent or if he should stop the [place]. Please advise.

## 2020-12-29 NOTE — Telephone Encounter (Signed)
Patient notified and will continue Xhance 1 spray per nostril daily and follow up with [place] if issues persist .

## 2020-12-29 NOTE — Telephone Encounter (Signed)
Patient is on [place] and he is also using Xhance. He wants to know if he needs to continue using Xhance since he is getting Dupixent shots now.

## 2021-01-07 ENCOUNTER — Other Ambulatory Visit: Payer: Self-pay

## 2021-01-07 ENCOUNTER — Ambulatory Visit (INDEPENDENT_AMBULATORY_CARE_PROVIDER_SITE_OTHER): Payer: 59

## 2021-01-07 DIAGNOSIS — J339 Nasal polyp, unspecified: Secondary | ICD-10-CM | POA: Diagnosis not present

## 2021-01-21 ENCOUNTER — Other Ambulatory Visit: Payer: Self-pay

## 2021-01-21 ENCOUNTER — Ambulatory Visit (INDEPENDENT_AMBULATORY_CARE_PROVIDER_SITE_OTHER): Payer: 59 | Admitting: *Deleted

## 2021-01-21 DIAGNOSIS — J339 Nasal polyp, unspecified: Secondary | ICD-10-CM | POA: Diagnosis not present

## 2021-02-04 ENCOUNTER — Other Ambulatory Visit: Payer: Self-pay

## 2021-02-04 ENCOUNTER — Ambulatory Visit (INDEPENDENT_AMBULATORY_CARE_PROVIDER_SITE_OTHER): Payer: 59 | Admitting: *Deleted

## 2021-02-04 DIAGNOSIS — J339 Nasal polyp, unspecified: Secondary | ICD-10-CM

## 2021-02-09 ENCOUNTER — Encounter: Payer: Self-pay | Admitting: Allergy

## 2021-02-09 ENCOUNTER — Ambulatory Visit (INDEPENDENT_AMBULATORY_CARE_PROVIDER_SITE_OTHER): Payer: 59 | Admitting: Allergy

## 2021-02-09 ENCOUNTER — Other Ambulatory Visit: Payer: Self-pay

## 2021-02-09 VITALS — BP 120/68 | HR 73 | Temp 97.7°F | Resp 16

## 2021-02-09 DIAGNOSIS — J4540 Moderate persistent asthma, uncomplicated: Secondary | ICD-10-CM | POA: Diagnosis not present

## 2021-02-09 DIAGNOSIS — R12 Heartburn: Secondary | ICD-10-CM | POA: Diagnosis not present

## 2021-02-09 DIAGNOSIS — J302 Other seasonal allergic rhinitis: Secondary | ICD-10-CM

## 2021-02-09 DIAGNOSIS — R430 Anosmia: Secondary | ICD-10-CM

## 2021-02-09 DIAGNOSIS — J3089 Other allergic rhinitis: Secondary | ICD-10-CM

## 2021-02-09 DIAGNOSIS — J339 Nasal polyp, unspecified: Secondary | ICD-10-CM | POA: Diagnosis not present

## 2021-02-09 NOTE — Assessment & Plan Note (Signed)
Past history - Heartburn symptoms improved since cutting down on alcohol and caffeine.   Continue lifestyle modifications.

## 2021-02-09 NOTE — Assessment & Plan Note (Signed)
Past history - Patient diagnosed with asthma as a child and only needed to use Advair 159mcg 1 puff twice a day during the spring and fall seasons however since May he has been having issues with daily coughing which is worse in the mornings. The albuterol makes him feel unusual. Normal CXR. 2021 spirometry showed: some restriction with 18% improvement in FEV1 post bronchodilator treatment. Interim history - since starting Dupixent for nasal polyps noted improvement in his asthma as well and stopped using Advair about 1 month ago with no worsening symptoms.   Today's spirometry showed some mild restriction - unchanged from last visit. Daily controller medication(s): none. During upper respiratory infections/asthma flares: start Advair 148mcg 1 puff twice a day for 1-2 weeks until your breathing symptoms return to baseline. Rinse mouth after each use.  May use levoalbuterol rescue inhaler 2 puffs every 4 to 6 hours as needed for shortness of breath, chest tightness, coughing, and wheezing. Monitor frequency of use.  Repeat spirometry at next visit.

## 2021-02-09 NOTE — Progress Notes (Signed)
Follow Up Note  RE: Russell Benton MRN: [id number] DOB: 1966/08/21 Date of Office Visit: 02/09/2021  Referring provider: Kelton Pillar, MD Primary care provider: Kelton Pillar, MD  Chief Complaint: Asthma (Doing better, every now and again he does his Advair some days are worse than others ) and Nasal Polyps (Saw Dr. Constance Holster and he said both sides are clean " Sinuses never been so clear in his adult life")  History of Present Illness: I had the pleasure of seeing Russell Benton for a follow up visit at the [facility] on 02/09/2021. He is a 55 y.o. male, who is being followed for asthma, nasal polyps on Dupixent, anosmia, allergic rhinitis and heartburn. His previous allergy office visit was on 11/12/2020 with Dr. Maudie Mercury. Today is a regular follow up visit.  Moderate persistent asthma Stopped Advair about 1 month ago and only using Advair as needed about 3-4 times the last 6 weeks. No worsening symptoms. Thinks the Dupixent is also helping his asthma.  Denies any ER/urgent care visits or prednisone use since the last visit. No albuterol use.   Nasal polyps Started on Dupixent every 2 weeks at the office. Hesitant about self-administration.  Still having anosmia - occasionally gets a whiff of something.  Saw ENT in March 2022 and no polyps at the last visit.  Stopped Xhance in March as he ran out of and occasionally using Flonase as needed with good benefit.   Seasonal and perennial allergic rhinitis Taking allegra daily and saline spray.  Minimal symptoms with Dupixent. Not taking Singulair anymore.   Interested in starting AIT. Usually flares in the spring months.   Heartburn Not taking any medications and for this. Cutting back on alcohol helped the reflux symptoms.   Assessment and Plan: Russell Benton is a 55 y.o. male with: Moderate persistent asthma without complication Past history - Patient diagnosed with asthma as a child and only needed to use Advair  154mcg 1 puff twice a day during the spring and fall seasons however since May he has been having issues with daily coughing which is worse in the mornings. The albuterol makes him feel unusual. Normal CXR. 2021 spirometry showed: some restriction with 18% improvement in FEV1 post bronchodilator treatment. Interim history - since starting Dupixent for nasal polyps noted improvement in his asthma as well and stopped using Advair about 1 month ago with no worsening symptoms.   Today's spirometry showed some mild restriction - unchanged from last visit. Daily controller medication(s): none. During upper respiratory infections/asthma flares: start Advair 141mcg 1 puff twice a day for 1-2 weeks until your breathing symptoms return to baseline. Rinse mouth after each use.  May use levoalbuterol rescue inhaler 2 puffs every 4 to 6 hours as needed for shortness of breath, chest tightness, coughing, and wheezing. Monitor frequency of use.  Repeat spirometry at next visit.   Nasal polyps Past history - ENT noted polyps on the right side status post surgery via rhinoscopy. Still having anosmia. Interim history - no polyps per ENT since started Dupixent. Stopped Xhance and only using Flonase prn. Anosmia is still unchanged - patient getting second ENT opinion.   May use nasal saline spray (i.e., Simply Saline) as needed.  May use Flonase (fluticasone) nasal spray 1 spray per nostril twice a day as needed for nasal congestion.   Continue Dupixent injections - every 2 weeks.  If you decide you want to do this at home let us know.   Anosmia  See assessment and plan as above.  Seasonal and perennial allergic rhinitis Past history - Perennial rhinitis symptoms for 10+ years which flares in the spring and fall. The last few years noticed worsening symptoms. Skin testing in March 2021 by outside allergist was positive to trees, grass, weed, mold, dog, cat, dust mites, cockroach, feathers. Evaluated by ENT and  CT sinus showed pansinusitis - no improvement with clindamycin. Underwent sinus surgery on 03/09/2020 and since then had anosmia, ageusia, fatigue, coughing. He received his second Moderna vaccine 8 days before surgery. Had negative Covid-19 testing x 2. Nettipot worsens symptoms.  Interim history - controlled with below regimen.   Continue environmental control measures.  Allergy injections would be a good long term option - see handout.   Had a detailed discussion with patient/family that clinical history is suggestive of allergic rhinitis, and may benefit from allergy immunotherapy (AIT). Discussed in detail regarding the dosing, schedule, side effects (mild to moderate local allergic reaction and rarely systemic allergic reactions including anaphylaxis), and benefits (significant improvement in nasal symptoms, seasonal flares of asthma) of immunotherapy with the patient. There is significant time commitment involved with allergy shots, which includes weekly immunotherapy injections for first 9-12 months and then biweekly to monthly injections for 3-5 years. Consent was signed.  Check with your insurance regarding cost and let us know when ready to start.   Once on AIT for a few years maybe able to stop Dupixent for nasal polyps if it's triggered by allergies.   May use over the counter antihistamines such as Allegra (fexofenadine) daily.  May use Flonase (fluticasone) nasal spray 1 spray per nostril twice a day as needed for nasal congestion.   Heartburn Past history - Heartburn symptoms improved since cutting down on alcohol and caffeine.   Continue lifestyle modifications.   Return in about 3 months (around 05/11/2021).  No orders of the defined types were placed in this encounter.  Lab Orders  No laboratory test(s) ordered today    Diagnostics: Spirometry:  Tracings reviewed. His effort: Good reproducible efforts. FVC: 3.87L FEV1: 3.06L, 77% predicted FEV1/FVC ratio:  79% Interpretation: Spirometry consistent with possible restrictive disease.  Please see scanned spirometry results for details.  Medication List:  Current Outpatient Medications  Medication Sig Dispense Refill  . albuterol (VENTOLIN HFA) 108 (90 Base) MCG/ACT inhaler 2 puffs as needed    . diazepam (VALIUM) 5 MG tablet SMARTSIG:1 Tablet(s) By Mouth 1 to 2 Times Daily    . EPINEPHrine (AUVI-Q) 0.3 mg/0.3 mL IJ SOAJ injection Inject 0.3 mg into the muscle as needed for anaphylaxis. 2 each 1  . fexofenadine (ALLEGRA) 180 MG tablet Take by mouth.    . Fluticasone-Salmeterol (ADVAIR) 100-50 MCG/DOSE AEPB Inhale 1 puff into the lungs 2 (two) times daily.    Javier Docker Oil 1000 MG CAPS See admin instructions.    Marland Kitchen levalbuterol (XOPENEX HFA) 45 MCG/ACT inhaler Take 2 puffs every 4-6 hours as needed for coughing, wheezing, chest tightness or shortness of breath. Use with a spacer. 1 Inhaler 3  . sildenafil (VIAGRA) 50 MG tablet Take 50 mg by mouth as needed.     Current Facility-Administered Medications  Medication Dose Route Frequency Provider Last Rate Last Admin  . dupilumab (DUPIXENT) prefilled syringe 300 mg  300 mg Subcutaneous Q14 Days Valentina Shaggy, MD   300 mg at 02/04/21 0086   Allergies: No Known Allergies I reviewed his past medical history, social history, family history, and environmental history and no significant  changes have been reported from his previous visit.  Review of Systems  Constitutional: Negative for appetite change, chills, fever and unexpected weight change.  HENT: Positive for congestion. Negative for rhinorrhea.   Eyes: Negative for itching.  Respiratory: Negative for cough, chest tightness, shortness of breath and wheezing.   Cardiovascular: Negative for chest pain.  Gastrointestinal: Negative for abdominal pain.  Genitourinary: Negative for difficulty urinating.  Skin: Negative for rash.  Allergic/Immunologic: Positive for environmental allergies.   Neurological: Negative for headaches.   Objective: BP 120/68 (BP Location: Left Arm, Patient Position: Lying right side, Cuff Size: Normal)   Pulse 73   Temp 97.7 F (36.5 C) (Temporal)   Resp 16   SpO2 98%  There is no height or weight on file to calculate BMI. Physical Exam Vitals and nursing note reviewed.  Constitutional:      Appearance: Normal appearance. He is well-developed.  HENT:     Head: Normocephalic and atraumatic.     Right Ear: Tympanic membrane and external ear normal.     Left Ear: Tympanic membrane and external ear normal.     Nose: Nose normal.     Mouth/Throat:     Mouth: Mucous membranes are moist.     Pharynx: Oropharynx is clear.  Eyes:     Conjunctiva/sclera: Conjunctivae normal.  Cardiovascular:     Rate and Rhythm: Normal rate and regular rhythm.     Heart sounds: Normal heart sounds. No murmur heard. No friction rub. No gallop.   Pulmonary:     Effort: Pulmonary effort is normal.     Breath sounds: Normal breath sounds. No wheezing, rhonchi or rales.  Musculoskeletal:     Cervical back: Neck supple.  Skin:    General: Skin is warm.     Findings: No rash.  Neurological:     Mental Status: He is alert and oriented to person, place, and time.  Psychiatric:        Behavior: Behavior normal.    Previous notes and tests were reviewed. The plan was reviewed with the patient/family, and all questions/concerned were addressed.  It was my pleasure to see Stran today and participate in his care. Please feel free to contact me with any questions or concerns.  Sincerely,  Rexene Alberts, DO Allergy & Immunology  [facility] office: [place] office: [phone]

## 2021-02-09 NOTE — Assessment & Plan Note (Signed)
.   See assessment and plan as above. 

## 2021-02-09 NOTE — Assessment & Plan Note (Addendum)
Past history - Perennial rhinitis symptoms for 10+ years which flares in the spring and fall. The last few years noticed worsening symptoms. Skin testing in March 2021 by outside allergist was positive to trees, grass, weed, mold, dog, cat, dust mites, cockroach, feathers. Evaluated by ENT and CT sinus showed pansinusitis - no improvement with clindamycin. Underwent sinus surgery on 03/09/2020 and since then had anosmia, ageusia, fatigue, coughing. He received his second Moderna vaccine 8 days before surgery. Had negative Covid-19 testing x 2. Nettipot worsens symptoms.  Interim history - controlled with below regimen.   Continue environmental control measures.  Allergy injections would be a good long term option - see handout.   Had a detailed discussion with patient/family that clinical history is suggestive of allergic rhinitis, and may benefit from allergy immunotherapy (AIT). Discussed in detail regarding the dosing, schedule, side effects (mild to moderate local allergic reaction and rarely systemic allergic reactions including anaphylaxis), and benefits (significant improvement in nasal symptoms, seasonal flares of asthma) of immunotherapy with the patient. There is significant time commitment involved with allergy shots, which includes weekly immunotherapy injections for first 9-12 months and then biweekly to monthly injections for 3-5 years. Consent was signed.  Check with your insurance regarding cost and let us know when ready to start.   Once on AIT for a few years maybe able to stop Dupixent for nasal polyps if it's triggered by allergies.   May use over the counter antihistamines such as Allegra (fexofenadine) daily.  May use Flonase (fluticasone) nasal spray 1 spray per nostril twice a day as needed for nasal congestion.

## 2021-02-09 NOTE — Patient Instructions (Addendum)
Asthma: Daily controller medication(s): none. During upper respiratory infections/asthma flares: start Advair 175mcg 1 puff twice a day for 1-2 weeks until your breathing symptoms return to baseline. Rinse mouth after each use.  May use levoalbuterol rescue inhaler 2 puffs every 4 to 6 hours as needed for shortness of breath, chest tightness, coughing, and wheezing. Monitor frequency of use.  Asthma control goals:  Full participation in all desired activities (may need albuterol before activity) Albuterol use two times or less a week on average (not counting use with activity) Cough interfering with sleep two times or less a month Oral steroids no more than once a year No hospitalizations  Polyps/loss of taste and smell:   May use nasal saline spray (i.e., Simply Saline) as needed.  May use Flonase (fluticasone) nasal spray 1 spray per nostril twice a day as needed for nasal congestion.   Continue Dupixent injections - every 2 weeks.  If you decide you want to do this at home let us know.   Environmental allergies:  Skin testing in March 2021 by outside allergist was positive to trees, grass, weed, mold, dog, cat, dust mites, cockroach, feathers.   Continue environmental control measures.  Allergy injections would be a good long term option - see handout.  Check with your insurance regarding cost and let us know when ready to start.   May use over the counter antihistamines such as Allegra (fexofenadine) daily.  Heartburn:  Continue lifestyle modifications.   Follow up in 3 months or sooner if needed.  Reducing Pollen Exposure . Pollen seasons: trees (spring), grass (summer) and ragweed/weeds (fall). Marland Kitchen Keep windows closed in your home and car to lower pollen exposure.  Susa Simmonds air conditioning in the bedroom and throughout the house if possible.  . Avoid going out in dry windy days - especially early morning. . Pollen counts are highest between 5 - 10 AM and on dry, hot  and windy days.  . Save outside activities for late afternoon or after a heavy rain, when pollen levels are lower.  . Avoid mowing of grass if you have grass pollen allergy. Marland Kitchen Be aware that pollen can also be transported indoors on people and pets.  . Dry your clothes in an automatic dryer rather than hanging them outside where they might collect pollen.  . Rinse hair and eyes before bedtime. Pet Allergen Avoidance: . Contrary to popular opinion, there are no "hypoallergenic" breeds of dogs or cats. That is because people are not allergic to an animal's hair, but to an allergen found in the animal's saliva, dander (dead skin flakes) or urine. Pet allergy symptoms typically occur within minutes. For some people, symptoms can build up and become most severe 8 to 12 hours after contact with the animal. People with severe allergies can experience reactions in public places if dander has been transported on the pet owners' clothing. Marland Kitchen Keeping an animal outdoors is only a partial solution, since homes with pets in the yard still have higher concentrations of animal allergens. . Before getting a pet, ask your allergist to determine if you are allergic to animals. If your pet is already considered part of your family, try to minimize contact and keep the pet out of the bedroom and other rooms where you spend a great deal of time. . As with dust mites, vacuum carpets often or replace carpet with a hardwood floor, tile or linoleum. . High-efficiency particulate air (HEPA) cleaners can reduce allergen levels over time. . While  dander and saliva are the source of cat and dog allergens, urine is the source of allergens from rabbits, hamsters, mice and [place] pigs; so ask a non-allergic family member to clean the animal's cage. . If you have a pet allergy, talk to your allergist about the potential for allergy immunotherapy (allergy shots). This strategy can often provide long-term relief.

## 2021-02-09 NOTE — Assessment & Plan Note (Signed)
Past history - ENT noted polyps on the right side status post surgery via rhinoscopy. Still having anosmia. Interim history - no polyps per ENT since started Dupixent. Stopped Xhance and only using Flonase prn. Anosmia is still unchanged - patient getting second ENT opinion.   May use nasal saline spray (i.e., Simply Saline) as needed.  May use Flonase (fluticasone) nasal spray 1 spray per nostril twice a day as needed for nasal congestion.   Continue Dupixent injections - every 2 weeks.  If you decide you want to do this at home let us know.

## 2021-02-18 ENCOUNTER — Other Ambulatory Visit: Payer: Self-pay

## 2021-02-18 ENCOUNTER — Ambulatory Visit (INDEPENDENT_AMBULATORY_CARE_PROVIDER_SITE_OTHER): Payer: 59 | Admitting: *Deleted

## 2021-02-18 DIAGNOSIS — J339 Nasal polyp, unspecified: Secondary | ICD-10-CM

## 2021-03-01 ENCOUNTER — Ambulatory Visit (INDEPENDENT_AMBULATORY_CARE_PROVIDER_SITE_OTHER): Payer: 59 | Admitting: *Deleted

## 2021-03-01 ENCOUNTER — Other Ambulatory Visit: Payer: Self-pay

## 2021-03-01 DIAGNOSIS — J339 Nasal polyp, unspecified: Secondary | ICD-10-CM

## 2021-03-03 ENCOUNTER — Ambulatory Visit: Payer: Self-pay

## 2021-03-15 ENCOUNTER — Ambulatory Visit (INDEPENDENT_AMBULATORY_CARE_PROVIDER_SITE_OTHER): Payer: 59 | Admitting: *Deleted

## 2021-03-15 ENCOUNTER — Other Ambulatory Visit: Payer: Self-pay

## 2021-03-15 DIAGNOSIS — J339 Nasal polyp, unspecified: Secondary | ICD-10-CM

## 2021-03-17 ENCOUNTER — Ambulatory Visit: Payer: Self-pay

## 2021-03-30 ENCOUNTER — Ambulatory Visit (INDEPENDENT_AMBULATORY_CARE_PROVIDER_SITE_OTHER): Payer: 59 | Admitting: *Deleted

## 2021-03-30 ENCOUNTER — Other Ambulatory Visit: Payer: Self-pay

## 2021-03-30 DIAGNOSIS — J339 Nasal polyp, unspecified: Secondary | ICD-10-CM

## 2021-04-13 ENCOUNTER — Other Ambulatory Visit: Payer: Self-pay

## 2021-04-13 ENCOUNTER — Ambulatory Visit (INDEPENDENT_AMBULATORY_CARE_PROVIDER_SITE_OTHER): Payer: 59 | Admitting: *Deleted

## 2021-04-13 DIAGNOSIS — J339 Nasal polyp, unspecified: Secondary | ICD-10-CM

## 2021-04-27 ENCOUNTER — Ambulatory Visit (INDEPENDENT_AMBULATORY_CARE_PROVIDER_SITE_OTHER): Payer: 59 | Admitting: *Deleted

## 2021-04-27 ENCOUNTER — Other Ambulatory Visit: Payer: Self-pay

## 2021-04-27 DIAGNOSIS — J339 Nasal polyp, unspecified: Secondary | ICD-10-CM | POA: Diagnosis not present

## 2021-05-14 ENCOUNTER — Ambulatory Visit (INDEPENDENT_AMBULATORY_CARE_PROVIDER_SITE_OTHER): Payer: 59 | Admitting: Family Medicine

## 2021-05-14 ENCOUNTER — Other Ambulatory Visit: Payer: Self-pay

## 2021-05-14 DIAGNOSIS — J339 Nasal polyp, unspecified: Secondary | ICD-10-CM | POA: Diagnosis not present

## 2021-05-25 ENCOUNTER — Ambulatory Visit: Payer: 59 | Admitting: Allergy

## 2021-05-25 ENCOUNTER — Encounter: Payer: Self-pay | Admitting: Allergy

## 2021-05-25 ENCOUNTER — Other Ambulatory Visit: Payer: Self-pay

## 2021-05-25 VITALS — BP 124/76 | HR 90 | Temp 97.9°F | Resp 19 | Ht 71.0 in | Wt 198.4 lb

## 2021-05-25 DIAGNOSIS — R430 Anosmia: Secondary | ICD-10-CM | POA: Diagnosis not present

## 2021-05-25 DIAGNOSIS — J339 Nasal polyp, unspecified: Secondary | ICD-10-CM

## 2021-05-25 DIAGNOSIS — J302 Other seasonal allergic rhinitis: Secondary | ICD-10-CM

## 2021-05-25 DIAGNOSIS — J3089 Other allergic rhinitis: Secondary | ICD-10-CM | POA: Diagnosis not present

## 2021-05-25 DIAGNOSIS — J4540 Moderate persistent asthma, uncomplicated: Secondary | ICD-10-CM

## 2021-05-25 DIAGNOSIS — R12 Heartburn: Secondary | ICD-10-CM

## 2021-05-25 NOTE — Assessment & Plan Note (Signed)
Past history - ENT noted polyps on the right side status post surgery via rhinoscopy. Still having anosmia. Interim history - saw Dr. Benjamine Mola and anosmia probably not improving however noted some increased episodes of ability to taste/smell things. Patient unsure for how long to continue [place].  May use nasal saline spray (i.e., Simply Saline) as needed.  May use Flonase (fluticasone) nasal spray 1 spray per nostril twice a day as needed for nasal congestion.   Continue Dupixent injections - every 2 weeks.  If you decide you want to do this at home let us know.   This is probably also helping his asthma and environmental allergies.   Will re-evaluate at next visit regarding cessation.

## 2021-05-25 NOTE — Assessment & Plan Note (Signed)
.   See assessment and plan as above. 

## 2021-05-25 NOTE — Patient Instructions (Addendum)
Asthma: Daily controller medication(s): Advair 122mcg 1 puff once a day and rinse mouth after each use.  During upper respiratory infections/asthma flares: start Advair 140mcg 1 puff twice a day for 1-2 weeks until your breathing symptoms return to baseline. Rinse mouth after each use.  May use levoalbuterol rescue inhaler 2 puffs every 4 to 6 hours as needed for shortness of breath, chest tightness, coughing, and wheezing. Monitor frequency of use.  Asthma control goals:  Full participation in all desired activities (may need albuterol before activity) Albuterol use two times or less a week on average (not counting use with activity) Cough interfering with sleep two times or less a month Oral steroids no more than once a year No hospitalizations  Polyps/loss of taste and smell:  May use nasal saline spray (i.e., Simply Saline) as needed. May use Flonase (fluticasone) nasal spray 1 spray per nostril twice a day as needed for nasal congestion.  Continue Dupixent injections - every 2 weeks. If you decide you want to do this at home let us know.  This is probably also helping your asthma and environmental allergies.   Environmental allergies: Skin testing in March 2021 by outside allergist was positive to trees, grass, weed, mold, dog, cat, dust mites, cockroach, feathers.  Continue environmental control measures. Use over the counter antihistamines such as Zyrtec (cetirizine), Claritin (loratadine), Allegra (fexofenadine), or Xyzal (levocetirizine) daily as needed. May take twice a day during allergy flares. May switch antihistamines every few months.  Heartburn: Continue lifestyle modifications.   Follow up in 6 months or sooner if needed.  Reducing Pollen Exposure Pollen seasons: trees (spring), grass (summer) and ragweed/weeds (fall). Keep windows closed in your home and car to lower pollen exposure.  Install air conditioning in the bedroom and throughout the house if possible.   Avoid going out in dry windy days - especially early morning. Pollen counts are highest between 5 - 10 AM and on dry, hot and windy days.  Save outside activities for late afternoon or after a heavy rain, when pollen levels are lower.  Avoid mowing of grass if you have grass pollen allergy. Be aware that pollen can also be transported indoors on people and pets.  Dry your clothes in an automatic dryer rather than hanging them outside where they might collect pollen.  Rinse hair and eyes before bedtime. Pet Allergen Avoidance: Contrary to popular opinion, there are no "hypoallergenic" breeds of dogs or cats. That is because people are not allergic to an animal's hair, but to an allergen found in the animal's saliva, dander (dead skin flakes) or urine. Pet allergy symptoms typically occur within minutes. For some people, symptoms can build up and become most severe 8 to 12 hours after contact with the animal. People with severe allergies can experience reactions in public places if dander has been transported on the pet owners' clothing. Keeping an animal outdoors is only a partial solution, since homes with pets in the yard still have higher concentrations of animal allergens. Before getting a pet, ask your allergist to determine if you are allergic to animals. If your pet is already considered part of your family, try to minimize contact and keep the pet out of the bedroom and other rooms where you spend a great deal of time. As with dust mites, vacuum carpets often or replace carpet with a hardwood floor, tile or linoleum. High-efficiency particulate air (HEPA) cleaners can reduce allergen levels over time. While dander and saliva are the source of  cat and dog allergens, urine is the source of allergens from rabbits, hamsters, mice and [place] pigs; so ask a non-allergic family member to clean the animal's cage. If you have a pet allergy, talk to your allergist about the potential for allergy  immunotherapy (allergy shots). This strategy can often provide long-term relief

## 2021-05-25 NOTE — Assessment & Plan Note (Signed)
Past history - Heartburn symptoms improved since cutting down on alcohol and caffeine.   Continue lifestyle modifications.

## 2021-05-25 NOTE — Assessment & Plan Note (Addendum)
Past history - Patient diagnosed with asthma as a child and only needed to use Advair 177mcg 1 puff twice a day during the spring and fall seasons however since May he has been having issues with daily coughing which is worse in the mornings. The albuterol makes him feel unusual. Normal CXR. 2021 spirometry showed: some restriction with 18% improvement in FEV1 post bronchodilator treatment. Interim history - using Advair in the mornings due to chest tightness. Dupixent most likely helping asthma symptoms as well.  Today's spirometry was normal. ACT score 22. . Daily controller medication(s): Advair 140mcg 1 puff once a day and rinse mouth after each use.  . During upper respiratory infections/asthma flares: start Advair 164mcg 1 puff twice a day for 1-2 weeks until your breathing symptoms return to baseline. Rinse mouth after each use.  . May use levoalbuterol rescue inhaler 2 puffs every 4 to 6 hours as needed for shortness of breath, chest tightness, coughing, and wheezing. Monitor frequency of use.   Get spirometry at next visit.

## 2021-05-25 NOTE — Assessment & Plan Note (Signed)
Past history - Perennial rhinitis symptoms for 10+ years which flares in the spring and fall.  Skin testing in March 2021 by outside allergist was positive to trees, grass, weed, mold, dog, cat, dust mites, cockroach, feathers. Evaluated by ENT and CT sinus showed pansinusitis - no improvement with clindamycin. Underwent sinus surgery on 03/09/2020 and since then had anosmia, ageusia, fatigue, coughing. He received his second Moderna vaccine 8 days before surgery. Had negative Covid-19 testing x 2. Nettipot worsens symptoms.  Interim history - stable with taking only allegra daily. Not interested in starting AIT.  Continue environmental control measures.  Use over the counter antihistamines such as Zyrtec (cetirizine), Claritin (loratadine), Allegra (fexofenadine), or Xyzal (levocetirizine) daily as needed. May take twice a day during allergy flares. May switch antihistamines every few months.

## 2021-05-25 NOTE — Progress Notes (Signed)
Follow Up Note  RE: Russell Benton MRN: [id number] DOB: [birth year] Date of Office Visit: 05/25/2021  Referring provider: Kelton Pillar, MD Primary care provider: Kelton Pillar, MD  Chief Complaint: Follow-up and Asthma (Pt states overall symptoms is managed with treatment. Still have no taste or smell.)  History of Present Illness: I had the pleasure of seeing Russell Benton for a follow up visit at the [facility] [place] on 05/25/2021. He is a 55 y.o. male, who is being followed for asthma, nasal polyps on Dupixent, anosmia, allergic rhinitis and heartburn. His previous allergy office visit was on 02/09/2021 with Dr. Maudie Mercury. Today is a regular follow up visit.  Moderate persistent asthma Act score 22 Currently using Advair 1100mcg 1 puff once a day 4-5 days per week with good benefit - usually has chest tightness in the mornings.  Denies any SOB, coughing, wheezing, nocturnal awakenings, ER/urgent care visits or prednisone use since the last visit.  Nasal polyps/anosmia Currently on Dupixent every 2 weeks at the [facility] office with no issues. Prefers to get injections in the office.   Patient saw Dr. Benjamine Mola - and was told anosmia will most likely not improve.  However, he is sometimes smelling/tasting some things more frequently and he is hopeful.   Only using saline sprays a few times throughout the day. Denies any nasal congestion.   Seasonal and perennial allergic rhinitis Currently taking Allegra daily only. Stopped Singulair.  Not interested in starting AIT.  Heartburn Improved - patient cut back on alcohol and caffeine.  Takes tums as needed.  Knee surgery in May went well. He may need a shoulder surgery as well.  He has been doing breathing exercises and started to take a 3 min cold bath.   Assessment and Plan: Russell Benton is a 55 y.o. male with: Moderate persistent asthma without complication Past history - Patient diagnosed with asthma as a child and only  needed to use Advair 128mcg 1 puff twice a day during the spring and fall seasons however since May he has been having issues with daily coughing which is worse in the mornings. The albuterol makes him feel unusual. Normal CXR. 2021 spirometry showed: some restriction with 18% improvement in FEV1 post bronchodilator treatment. Interim history - using Advair in the mornings due to chest tightness. Dupixent most likely helping asthma symptoms as well.  Today's spirometry was normal. ACT score 22. Daily controller medication(s): Advair 134mcg 1 puff once a day and rinse mouth after each use.  During upper respiratory infections/asthma flares: start Advair 153mcg 1 puff twice a day for 1-2 weeks until your breathing symptoms return to baseline. Rinse mouth after each use.  May use levoalbuterol rescue inhaler 2 puffs every 4 to 6 hours as needed for shortness of breath, chest tightness, coughing, and wheezing. Monitor frequency of use.  Get spirometry at next visit.  Seasonal and perennial allergic rhinitis Past history - Perennial rhinitis symptoms for 10+ years which flares in the spring and fall.  Skin testing in March 2021 by outside allergist was positive to trees, grass, weed, mold, dog, cat, dust mites, cockroach, feathers. Evaluated by ENT and CT sinus showed pansinusitis - no improvement with clindamycin. Underwent sinus surgery on 03/09/2020 and since then had anosmia, ageusia, fatigue, coughing. He received his second Moderna vaccine 8 days before surgery. Had negative Covid-19 testing x 2. Nettipot worsens symptoms.  Interim history - stable with taking only allegra daily. Not interested in starting AIT. Continue environmental control  measures. Use over the counter antihistamines such as Zyrtec (cetirizine), Claritin (loratadine), Allegra (fexofenadine), or Xyzal (levocetirizine) daily as needed. May take twice a day during allergy flares. May switch antihistamines every few months.  Nasal  polyps Past history - ENT noted polyps on the right side status post surgery via rhinoscopy. Still having anosmia. Interim history - saw Dr. Benjamine Mola and anosmia probably not improving however noted some increased episodes of ability to taste/smell things. Patient unsure for how long to continue [place]. May use nasal saline spray (i.e., Simply Saline) as needed. May use Flonase (fluticasone) nasal spray 1 spray per nostril twice a day as needed for nasal congestion.  Continue Dupixent injections - every 2 weeks. If you decide you want to do this at home let us know.  This is probably also helping his asthma and environmental allergies.  Will re-evaluate at next visit regarding cessation.   Anosmia See assessment and plan as above.  Heartburn Past history - Heartburn symptoms improved since cutting down on alcohol and caffeine.  Continue lifestyle modifications.   Return in about 6 months (around 11/25/2021).  No orders of the defined types were placed in this encounter.  Lab Orders  No laboratory test(s) ordered today    Diagnostics: Spirometry:  Tracings reviewed. His effort: Good reproducible efforts. FVC: 4.08L FEV1: 3.26L, 91% predicted FEV1/FVC ratio: 80% Interpretation: Spirometry consistent with normal pattern.  Please see scanned spirometry results for details.  Medication List:  Current Outpatient Medications  Medication Sig Dispense Refill   albuterol (VENTOLIN HFA) 108 (90 Base) MCG/ACT inhaler 2 puffs as needed     diazepam (VALIUM) 5 MG tablet SMARTSIG:1 Tablet(s) By Mouth 1 to 2 Times Daily     EPINEPHrine (AUVI-Q) 0.3 mg/0.3 mL IJ SOAJ injection Inject 0.3 mg into the muscle as needed for anaphylaxis. 2 each 1   fexofenadine (ALLEGRA) 180 MG tablet Take by mouth.     Fluticasone-Salmeterol (ADVAIR) 100-50 MCG/DOSE AEPB Inhale 1 puff into the lungs 2 (two) times daily.     Krill Oil 1000 MG CAPS See admin instructions.     levalbuterol (XOPENEX HFA) 45 MCG/ACT  inhaler Take 2 puffs every 4-6 hours as needed for coughing, wheezing, chest tightness or shortness of breath. Use with a spacer. 1 Inhaler 3   sildenafil (VIAGRA) 50 MG tablet Take 50 mg by mouth as needed.     Current Facility-Administered Medications  Medication Dose Route Frequency Provider Last Rate Last Admin   dupilumab (DUPIXENT) prefilled syringe 300 mg  300 mg Subcutaneous Q14 Days Valentina Shaggy, MD   300 mg at 05/14/21 1152   Allergies: No Known Allergies I reviewed his past medical history, social history, family history, and environmental history and no significant changes have been reported from his previous visit.  Review of Systems  Constitutional:  Negative for appetite change, chills, fever and unexpected weight change.  HENT:  Negative for congestion and rhinorrhea.   Eyes:  Negative for itching.  Respiratory:  Negative for cough, chest tightness, shortness of breath and wheezing.   Cardiovascular:  Negative for chest pain.  Gastrointestinal:  Negative for abdominal pain.  Genitourinary:  Negative for difficulty urinating.  Skin:  Negative for rash.  Allergic/Immunologic: Positive for environmental allergies.  Neurological:  Negative for headaches.   Objective: BP 124/76 (BP Location: Right Arm, Patient Position: Sitting, Cuff Size: Normal)   Pulse 90   Temp 97.9 F (36.6 C) (Temporal)   Resp 19   Ht 5\' 11"  (1.803  m)   Wt 198 lb 6.4 oz (90 kg)   SpO2 97%   BMI 27.67 kg/m  Body mass index is 27.67 kg/m. Physical Exam Vitals and nursing note reviewed.  Constitutional:      Appearance: Normal appearance. He is well-developed.  HENT:     Head: Normocephalic and atraumatic.     Right Ear: Tympanic membrane and external ear normal.     Left Ear: Tympanic membrane and external ear normal.     Nose: Nose normal.     Mouth/Throat:     Mouth: Mucous membranes are moist.     Pharynx: Oropharynx is clear.  Eyes:     Conjunctiva/sclera: Conjunctivae  normal.  Cardiovascular:     Rate and Rhythm: Normal rate and regular rhythm.     Heart sounds: Normal heart sounds. No murmur heard.   No friction rub. No gallop.  Pulmonary:     Effort: Pulmonary effort is normal.     Breath sounds: Normal breath sounds. No wheezing, rhonchi or rales.  Musculoskeletal:     Cervical back: Neck supple.  Skin:    General: Skin is warm.     Findings: No rash.  Neurological:     Mental Status: He is alert and oriented to person, place, and time.  Psychiatric:        Behavior: Behavior normal.   Previous notes and tests were reviewed. The plan was reviewed with the patient/family, and all questions/concerned were addressed.  It was my pleasure to see Berthold today and participate in his care. Please feel free to contact me with any questions or concerns.  Sincerely,  Rexene Alberts, DO Allergy & Immunology  [facility] office: [place] office: [phone]

## 2021-05-28 ENCOUNTER — Ambulatory Visit (INDEPENDENT_AMBULATORY_CARE_PROVIDER_SITE_OTHER): Payer: 59

## 2021-05-28 ENCOUNTER — Other Ambulatory Visit: Payer: Self-pay

## 2021-05-28 DIAGNOSIS — J339 Nasal polyp, unspecified: Secondary | ICD-10-CM | POA: Diagnosis not present

## 2021-06-11 ENCOUNTER — Other Ambulatory Visit: Payer: Self-pay

## 2021-06-11 ENCOUNTER — Ambulatory Visit (INDEPENDENT_AMBULATORY_CARE_PROVIDER_SITE_OTHER): Payer: 59

## 2021-06-11 DIAGNOSIS — J339 Nasal polyp, unspecified: Secondary | ICD-10-CM

## 2021-06-25 ENCOUNTER — Ambulatory Visit (INDEPENDENT_AMBULATORY_CARE_PROVIDER_SITE_OTHER): Payer: 59

## 2021-06-25 ENCOUNTER — Other Ambulatory Visit: Payer: Self-pay

## 2021-06-25 DIAGNOSIS — J339 Nasal polyp, unspecified: Secondary | ICD-10-CM | POA: Diagnosis not present

## 2021-07-08 ENCOUNTER — Ambulatory Visit (INDEPENDENT_AMBULATORY_CARE_PROVIDER_SITE_OTHER): Payer: 59 | Admitting: *Deleted

## 2021-07-08 ENCOUNTER — Ambulatory Visit: Payer: 59

## 2021-07-08 ENCOUNTER — Other Ambulatory Visit: Payer: Self-pay

## 2021-07-08 DIAGNOSIS — J339 Nasal polyp, unspecified: Secondary | ICD-10-CM

## 2021-07-09 ENCOUNTER — Ambulatory Visit: Payer: 59

## 2021-07-22 ENCOUNTER — Ambulatory Visit (INDEPENDENT_AMBULATORY_CARE_PROVIDER_SITE_OTHER): Payer: 59 | Admitting: *Deleted

## 2021-07-22 ENCOUNTER — Other Ambulatory Visit: Payer: Self-pay

## 2021-07-22 DIAGNOSIS — J339 Nasal polyp, unspecified: Secondary | ICD-10-CM | POA: Diagnosis not present

## 2021-07-29 ENCOUNTER — Ambulatory Visit: Payer: 59

## 2021-08-04 ENCOUNTER — Other Ambulatory Visit: Payer: Self-pay

## 2021-08-04 ENCOUNTER — Ambulatory Visit (INDEPENDENT_AMBULATORY_CARE_PROVIDER_SITE_OTHER): Payer: 59 | Admitting: *Deleted

## 2021-08-04 DIAGNOSIS — J339 Nasal polyp, unspecified: Secondary | ICD-10-CM

## 2021-08-18 ENCOUNTER — Other Ambulatory Visit: Payer: Self-pay

## 2021-08-18 ENCOUNTER — Ambulatory Visit (INDEPENDENT_AMBULATORY_CARE_PROVIDER_SITE_OTHER): Payer: 59

## 2021-08-18 DIAGNOSIS — J339 Nasal polyp, unspecified: Secondary | ICD-10-CM | POA: Diagnosis not present

## 2021-09-01 ENCOUNTER — Ambulatory Visit: Payer: 59

## 2021-09-01 ENCOUNTER — Ambulatory Visit (INDEPENDENT_AMBULATORY_CARE_PROVIDER_SITE_OTHER): Payer: 59

## 2021-09-01 ENCOUNTER — Other Ambulatory Visit: Payer: Self-pay

## 2021-09-01 DIAGNOSIS — J339 Nasal polyp, unspecified: Secondary | ICD-10-CM | POA: Diagnosis not present

## 2021-09-15 ENCOUNTER — Ambulatory Visit (INDEPENDENT_AMBULATORY_CARE_PROVIDER_SITE_OTHER): Payer: 59

## 2021-09-15 ENCOUNTER — Other Ambulatory Visit: Payer: Self-pay

## 2021-09-15 DIAGNOSIS — J339 Nasal polyp, unspecified: Secondary | ICD-10-CM

## 2021-09-29 ENCOUNTER — Ambulatory Visit (INDEPENDENT_AMBULATORY_CARE_PROVIDER_SITE_OTHER): Payer: 59

## 2021-09-29 ENCOUNTER — Other Ambulatory Visit: Payer: Self-pay

## 2021-09-29 DIAGNOSIS — J339 Nasal polyp, unspecified: Secondary | ICD-10-CM

## 2021-10-13 ENCOUNTER — Other Ambulatory Visit: Payer: Self-pay

## 2021-10-13 ENCOUNTER — Other Ambulatory Visit: Payer: Self-pay | Admitting: Allergy

## 2021-10-13 ENCOUNTER — Ambulatory Visit (INDEPENDENT_AMBULATORY_CARE_PROVIDER_SITE_OTHER): Payer: 59

## 2021-10-13 DIAGNOSIS — J339 Nasal polyp, unspecified: Secondary | ICD-10-CM | POA: Diagnosis not present

## 2021-10-13 NOTE — Telephone Encounter (Signed)
Please advise 

## 2021-10-28 ENCOUNTER — Ambulatory Visit (INDEPENDENT_AMBULATORY_CARE_PROVIDER_SITE_OTHER): Payer: 59

## 2021-10-28 ENCOUNTER — Other Ambulatory Visit: Payer: Self-pay

## 2021-10-28 DIAGNOSIS — J339 Nasal polyp, unspecified: Secondary | ICD-10-CM | POA: Diagnosis not present

## 2021-11-11 ENCOUNTER — Ambulatory Visit (INDEPENDENT_AMBULATORY_CARE_PROVIDER_SITE_OTHER): Payer: 59

## 2021-11-11 ENCOUNTER — Other Ambulatory Visit: Payer: Self-pay

## 2021-11-11 DIAGNOSIS — J339 Nasal polyp, unspecified: Secondary | ICD-10-CM

## 2021-11-25 ENCOUNTER — Ambulatory Visit (INDEPENDENT_AMBULATORY_CARE_PROVIDER_SITE_OTHER): Payer: 59

## 2021-11-25 ENCOUNTER — Other Ambulatory Visit: Payer: Self-pay

## 2021-11-25 DIAGNOSIS — J339 Nasal polyp, unspecified: Secondary | ICD-10-CM | POA: Diagnosis not present

## 2021-12-01 NOTE — Progress Notes (Signed)
Follow Up Note  RE: Russell Benton MRN: [id number] DOB: [birth year] Date of Office Visit: 12/02/2021  Referring provider: Kelton Pillar, MD Primary care provider: Kelton Pillar, MD  Chief Complaint: Asthma (Breathing has been soso wants to stop dupixent 1 more shot next week that will be the last shot)  History of Present Illness: I had the pleasure of seeing Russell Benton for a follow up visit at the [facility] [place] on 12/02/2021. He is a 56 y.o. male, who is being followed for asthma, nasal polyps on Dupixent, allergic rhinitis and heartburn. His previous allergy office visit was on 05/25/2021 with Dr. Maudie Mercury. Today is a regular follow up visit.  Moderate persistent asthma Currently taking Dupixent every 2 weeks with unknown benefit. Takes Advair only as needed about 1-2 times per week. No rescue inhaler use.  Sometimes wakes up in the mornings with coughing and shortness of breath. This at times goes away with walking and doing breathing exercise.   Denies any ER/urgent care visits or prednisone use since the last visit.  Seasonal and perennial allergic rhinitis Asymptomatic with no medications.  Nasal polyps Occasionally he is able to smell things now which is better but only lasts for a few seconds. Unable to taste things.  Only using saline nasal spray 1-2 times per day. Not using any Flonase.   Heartburn Improving as he cut down alcohol.   Had right shoulder surgery.  Assessment and Plan: Brett is a 56 y.o. male with: Moderate persistent asthma without complication Past history - Patient diagnosed with asthma as a child and only needed to use Advair 133mg 1 puff twice a day during the spring and fall seasons however since May he has been having issues with daily coughing which is worse in the mornings. The albuterol makes him feel unusual. Normal CXR. 2021 spirometry showed: some restriction with 18% improvement in FEV1 post bronchodilator  treatment. Interim history - using Advair only about 1-2 times per week in the mornings.  Today's spirometry showed some mild restriction. Daily controller medication(s): if asthma symptoms worsen after stopping Dupixent then start:  Advair 1025m 1 puff once a day and rinse mouth after each use.  During upper respiratory infections/asthma flares: start Advair 10051m1 puff twice a day for 1-2 weeks until your breathing symptoms return to baseline. Rinse mouth after each use.  May use levoalbuterol rescue inhaler 2 puffs every 4 to 6 hours as needed for shortness of breath, chest tightness, coughing, and wheezing. Monitor frequency of use.  Get spirometry at next visit.  Seasonal and perennial allergic rhinitis Past history - Perennial rhinitis symptoms for 10+ years which flares in the spring and fall.  Skin testing in March 2021 by outside allergist was positive to trees, grass, weed, mold, dog, cat, dust mites, cockroach, feathers. Evaluated by ENT and CT sinus showed pansinusitis - no improvement with clindamycin. Underwent sinus surgery on 03/09/2020 and since then had anosmia, ageusia, fatigue, coughing. He received his second Moderna vaccine 8 days before surgery. Had negative Covid-19 testing x 2. Nettipot worsens symptoms.  Interim history - asymptomatic with no meds. Continue environmental control measures. Use over the counter antihistamines such as Zyrtec (cetirizine), Claritin (loratadine), Allegra (fexofenadine), or Xyzal (levocetirizine) daily as needed. May take twice a day during allergy flares. May switch antihistamines every few months. Use Flonase (fluticasone) nasal spray 1 spray per nostril twice a day as needed for nasal congestion.  Consider allergy injections for long term control  if above medications do not help the symptoms - information given.   Nasal polyps Past history - ENT noted polyps on the right side status post surgery via rhinoscopy. Still having anosmia.   Interim history - increased episodes of ability to smell things for a few seconds. Wants to stop Dupixent.  May use nasal saline spray (i.e., Simply Saline) as needed. May use Flonase (fluticasone) nasal spray 1 spray per nostril twice a day as needed for nasal congestion.  Dupixent injections - stop after the next injection. If noticing worsening symptoms then let us know.   Anosmia Try the smell training kit - sample given in the office.   Heartburn Past history - Heartburn symptoms improved since cutting down on alcohol and caffeine.  Continue lifestyle and dietary modifications.  Return in about 6 months (around 06/01/2022).  No orders of the defined types were placed in this encounter.  Lab Orders  No laboratory test(s) ordered today    Diagnostics: Spirometry:  Tracings reviewed. His effort: Good reproducible efforts. FVC: 4.05L FEV1: 3.16L, 80% predicted FEV1/FVC ratio: 78% Interpretation: Spirometry consistent with possible restrictive disease.  Please see scanned spirometry results for details.  Medication List:  Current Outpatient Medications  Medication Sig Dispense Refill   albuterol (VENTOLIN HFA) 108 (90 Base) MCG/ACT inhaler 2 puffs as needed     DUPIXENT 300 MG/2ML prefilled syringe INJECT 300MG SUBCUTANEOUSLY EVERY OTHER WEEK 4 mL 11   EPINEPHrine (AUVI-Q) 0.3 mg/0.3 mL IJ SOAJ injection Inject 0.3 mg into the muscle as needed for anaphylaxis. 2 each 1   Fluticasone-Salmeterol (ADVAIR) 100-50 MCG/DOSE AEPB Inhale 1 puff into the lungs 2 (two) times daily.     Krill Oil 1000 MG CAPS See admin instructions.     sildenafil (VIAGRA) 50 MG tablet Take 50 mg by mouth as needed.     diazepam (VALIUM) 5 MG tablet SMARTSIG:1 Tablet(s) By Mouth 1 to 2 Times Daily (Patient not taking: Reported on 12/02/2021)     fexofenadine (ALLEGRA) 180 MG tablet Take by mouth. (Patient not taking: Reported on 12/02/2021)     Current Facility-Administered Medications  Medication Dose  Route Frequency Provider Last Rate Last Admin   dupilumab (DUPIXENT) prefilled syringe 300 mg  300 mg Subcutaneous Q14 Days Valentina Shaggy, MD   300 mg at 11/25/21 1136   Allergies: No Known Allergies I reviewed his past medical history, social history, family history, and environmental history and no significant changes have been reported from his previous visit.  Review of Systems  Constitutional:  Negative for appetite change, chills, fever and unexpected weight change.  HENT:  Negative for congestion and rhinorrhea.   Eyes:  Negative for itching.  Respiratory:  Positive for cough and shortness of breath. Negative for chest tightness and wheezing.   Cardiovascular:  Negative for chest pain.  Gastrointestinal:  Negative for abdominal pain.  Genitourinary:  Negative for difficulty urinating.  Skin:  Negative for rash.  Allergic/Immunologic: Positive for environmental allergies.  Neurological:  Negative for headaches.   Objective: BP 118/82    Pulse 66    Temp 97.9 F (36.6 C) (Temporal)    Resp 18    SpO2 100%  There is no height or weight on file to calculate BMI. Physical Exam Vitals and nursing note reviewed.  Constitutional:      Appearance: Normal appearance. He is well-developed.  HENT:     Head: Normocephalic and atraumatic.     Right Ear: External ear normal.  Left Ear: External ear normal.     Ears:     Comments: Some cerumen b/l.    Nose: Nose normal.     Mouth/Throat:     Mouth: Mucous membranes are moist.     Pharynx: Oropharynx is clear.  Eyes:     Conjunctiva/sclera: Conjunctivae normal.  Cardiovascular:     Rate and Rhythm: Normal rate and regular rhythm.     Heart sounds: Normal heart sounds. No murmur heard.   No friction rub. No gallop.  Pulmonary:     Effort: Pulmonary effort is normal.     Breath sounds: Normal breath sounds. No wheezing, rhonchi or rales.  Musculoskeletal:     Cervical back: Neck supple.  Skin:    General: Skin is warm.      Findings: No rash.  Neurological:     Mental Status: He is alert and oriented to person, place, and time.  Psychiatric:        Behavior: Behavior normal.  Previous notes and tests were reviewed. The plan was reviewed with the patient/family, and all questions/concerned were addressed.  It was my pleasure to see Webb today and participate in his care. Please feel free to contact me with any questions or concerns.  Sincerely,  Rexene Alberts, DO Allergy & Immunology  [facility] office: [place] office: [phone]

## 2021-12-02 ENCOUNTER — Ambulatory Visit: Payer: 59 | Admitting: Allergy

## 2021-12-02 ENCOUNTER — Other Ambulatory Visit: Payer: Self-pay

## 2021-12-02 ENCOUNTER — Encounter: Payer: Self-pay | Admitting: Allergy

## 2021-12-02 VITALS — BP 118/82 | HR 66 | Temp 97.9°F | Resp 18

## 2021-12-02 DIAGNOSIS — R430 Anosmia: Secondary | ICD-10-CM

## 2021-12-02 DIAGNOSIS — J339 Nasal polyp, unspecified: Secondary | ICD-10-CM | POA: Diagnosis not present

## 2021-12-02 DIAGNOSIS — J4540 Moderate persistent asthma, uncomplicated: Secondary | ICD-10-CM | POA: Diagnosis not present

## 2021-12-02 DIAGNOSIS — R12 Heartburn: Secondary | ICD-10-CM

## 2021-12-02 DIAGNOSIS — J302 Other seasonal allergic rhinitis: Secondary | ICD-10-CM

## 2021-12-02 DIAGNOSIS — J3089 Other allergic rhinitis: Secondary | ICD-10-CM | POA: Diagnosis not present

## 2021-12-02 NOTE — Assessment & Plan Note (Addendum)
Past history - ENT noted polyps on the right side status post surgery via rhinoscopy. Still having anosmia.  Interim history - increased episodes of ability to smell things for a few seconds. Wants to stop Dupixent.   May use nasal saline spray (i.e., Simply Saline) as needed.  May use Flonase (fluticasone) nasal spray 1 spray per nostril twice a day as needed for nasal congestion.   Dupixent injections - stop after the next injection.  If noticing worsening symptoms then let us know.

## 2021-12-02 NOTE — Assessment & Plan Note (Signed)
Past history - Heartburn symptoms improved since cutting down on alcohol and caffeine.   Continue lifestyle and dietary modifications.

## 2021-12-02 NOTE — Patient Instructions (Addendum)
Asthma: Daily controller medication(s): if you notice your asthma symptoms worsening please take: Advair 17mg 1 puff once a day and rinse mouth after each use.  During upper respiratory infections/asthma flares: start Advair 1043m 1 puff twice a day for 1-2 weeks until your breathing symptoms return to baseline. Rinse mouth after each use.  May use levoalbuterol rescue inhaler 2 puffs every 4 to 6 hours as needed for shortness of breath, chest tightness, coughing, and wheezing. Monitor frequency of use.  Asthma control goals:  Full participation in all desired activities (may need albuterol before activity) Albuterol use two times or less a week on average (not counting use with activity) Cough interfering with sleep two times or less a month Oral steroids no more than once a year No hospitalizations  Polyps/loss of taste and smell:  May use nasal saline spray (i.e., Simply Saline) as needed. May use Flonase (fluticasone) nasal spray 1 spray per nostril twice a day as needed for nasal congestion.  Try the smell training kit.  Dupixent injections - stop after the last injection. If you notice that your asthma and environmental allergies get worse then let usKoreanow.   Environmental allergies: Skin testing in March 2021 by outside allergist was positive to trees, grass, weed, mold, dog, cat, dust mites, cockroach, feathers.  Continue environmental control measures. Use over the counter antihistamines such as Zyrtec (cetirizine), Claritin (loratadine), Allegra (fexofenadine), or Xyzal (levocetirizine) daily as needed. May take twice a day during allergy flares. May switch antihistamines every few months. Use Flonase (fluticasone) nasal spray 1 spray per nostril twice a day as needed for nasal congestion.  Consider allergy injections for long term control if above medications do not help the symptoms - information given.   Heartburn: Continue lifestyle and dietary modifications.  Follow up  in 6 months or sooner if needed.

## 2021-12-02 NOTE — Assessment & Plan Note (Signed)
·   Try the smell training kit - sample given in the office.

## 2021-12-02 NOTE — Assessment & Plan Note (Signed)
Past history - Patient diagnosed with asthma as a child and only needed to use Advair 188mcg 1 puff twice a day during the spring and fall seasons however since May he has been having issues with daily coughing which is worse in the mornings. The albuterol makes him feel unusual. Normal CXR. 2021 spirometry showed: some restriction with 18% improvement in FEV1 post bronchodilator treatment. Interim history - using Advair only about 1-2 times per week in the mornings.  Today's spirometry showed some mild restriction.  Daily controller medication(s): if asthma symptoms worsen after stopping Dupixent then start:   Advair 159mcg 1 puff once a day and rinse mouth after each use.   During upper respiratory infections/asthma flares: start Advair 164mcg 1 puff twice a day for 1-2 weeks until your breathing symptoms return to baseline. Rinse mouth after each use.   May use levoalbuterol rescue inhaler 2 puffs every 4 to 6 hours as needed for shortness of breath, chest tightness, coughing, and wheezing. Monitor frequency of use.   Get spirometry at next visit.

## 2021-12-02 NOTE — Assessment & Plan Note (Signed)
Past history - Perennial rhinitis symptoms for 10+ years which flares in the spring and fall.  Skin testing in March 2021 by outside allergist was positive to trees, grass, weed, mold, dog, cat, dust mites, cockroach, feathers. Evaluated by ENT and CT sinus showed pansinusitis - no improvement with clindamycin. Underwent sinus surgery on 03/09/2020 and since then had anosmia, ageusia, fatigue, coughing. He received his second Moderna vaccine 8 days before surgery. Had negative Covid-19 testing x 2. Nettipot worsens symptoms.  Interim history - asymptomatic with no meds.  Continue environmental control measures.  Use over the counter antihistamines such as Zyrtec (cetirizine), Claritin (loratadine), Allegra (fexofenadine), or Xyzal (levocetirizine) daily as needed. May take twice a day during allergy flares. May switch antihistamines every few months.  Use Flonase (fluticasone) nasal spray 1 spray per nostril twice a day as needed for nasal congestion.   Consider allergy injections for long term control if above medications do not help the symptoms - information given.

## 2021-12-09 ENCOUNTER — Ambulatory Visit (INDEPENDENT_AMBULATORY_CARE_PROVIDER_SITE_OTHER): Payer: 59

## 2021-12-09 ENCOUNTER — Other Ambulatory Visit: Payer: Self-pay

## 2021-12-09 DIAGNOSIS — J339 Nasal polyp, unspecified: Secondary | ICD-10-CM

## 2022-05-28 IMAGING — CR DG CHEST 2V
2 series · 2 of 2 positions shown · non-contrast
Comparison: 07/20/2017.

CLINICAL DATA: Cough.

EXAM:
CHEST - 2 VIEW

[w chest pa]
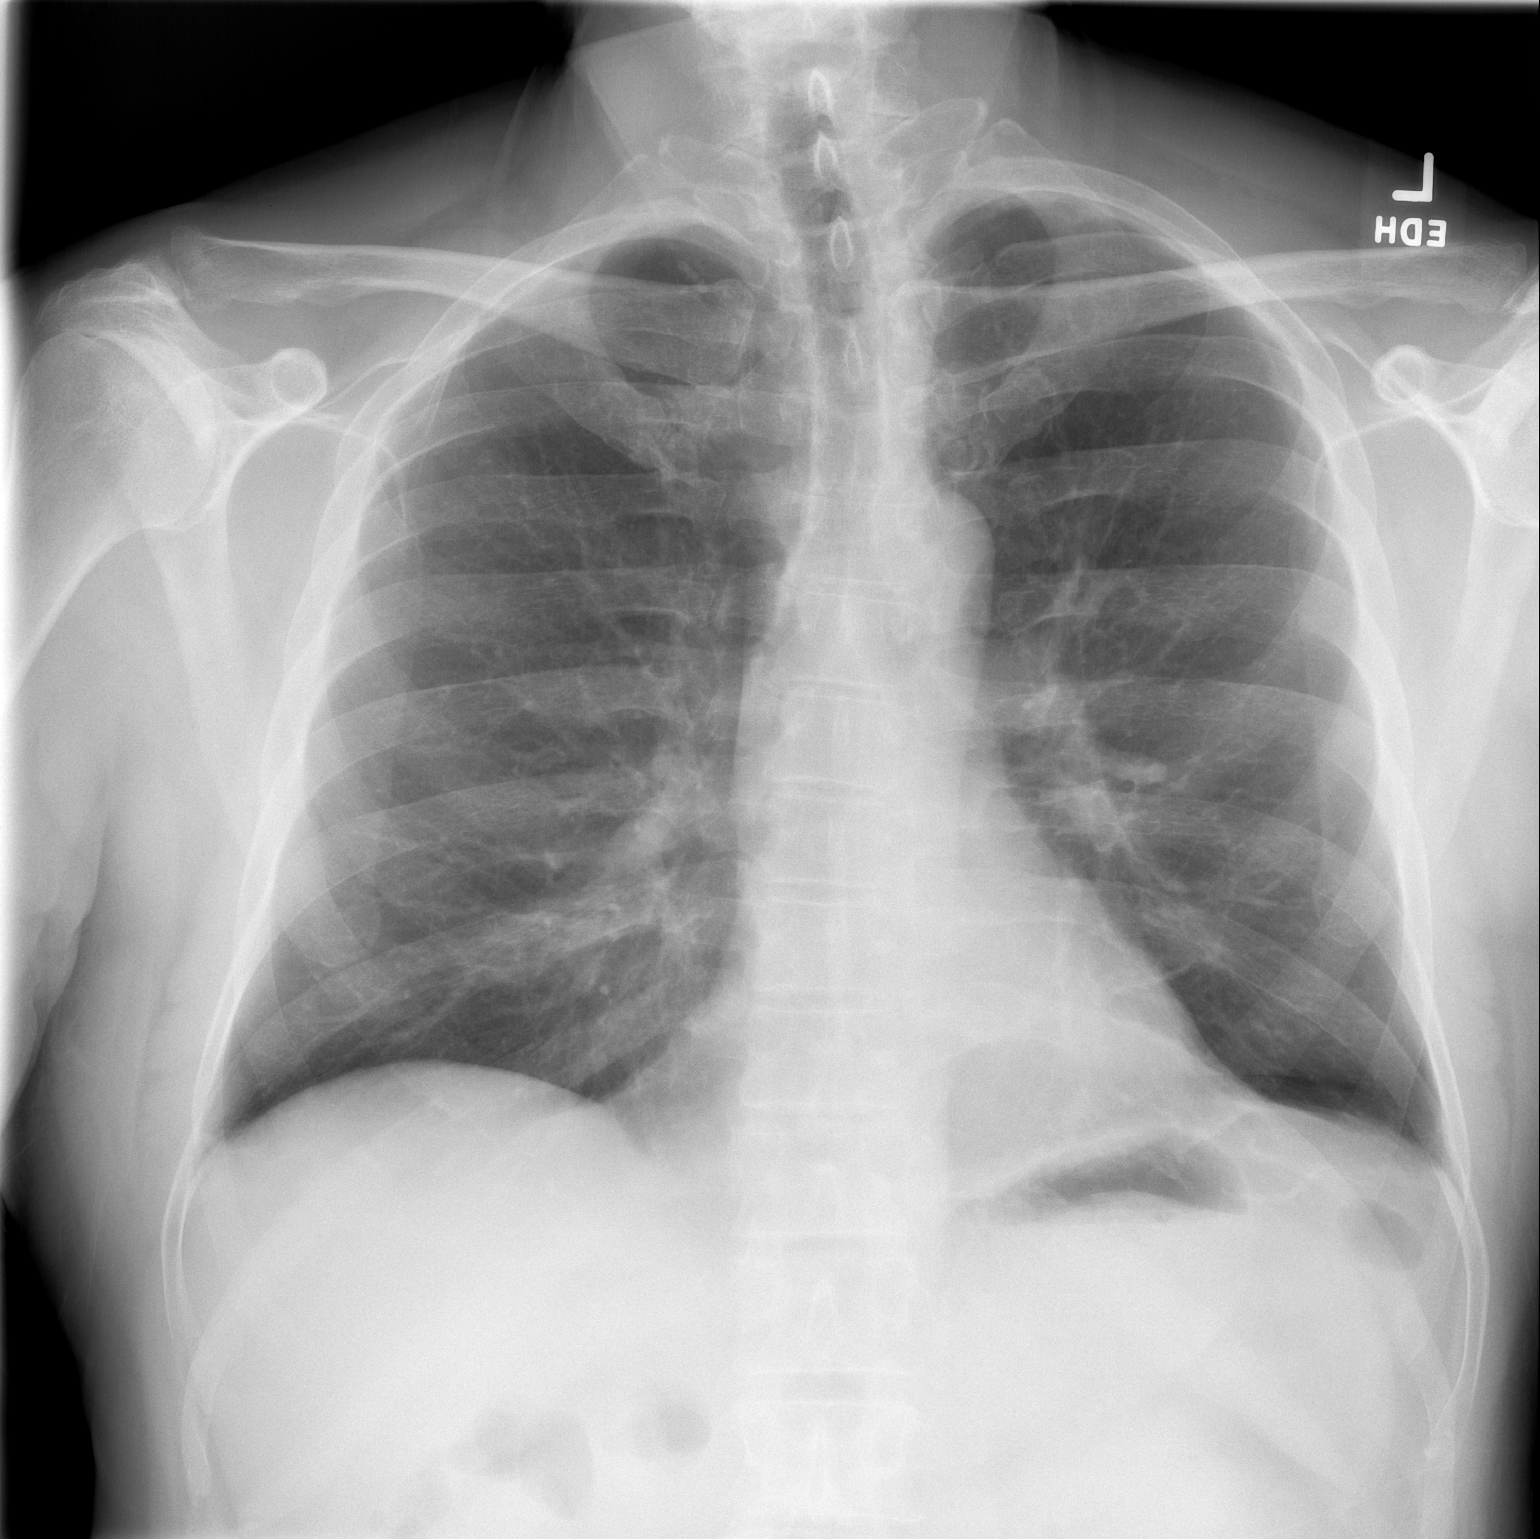

[w chest lat]
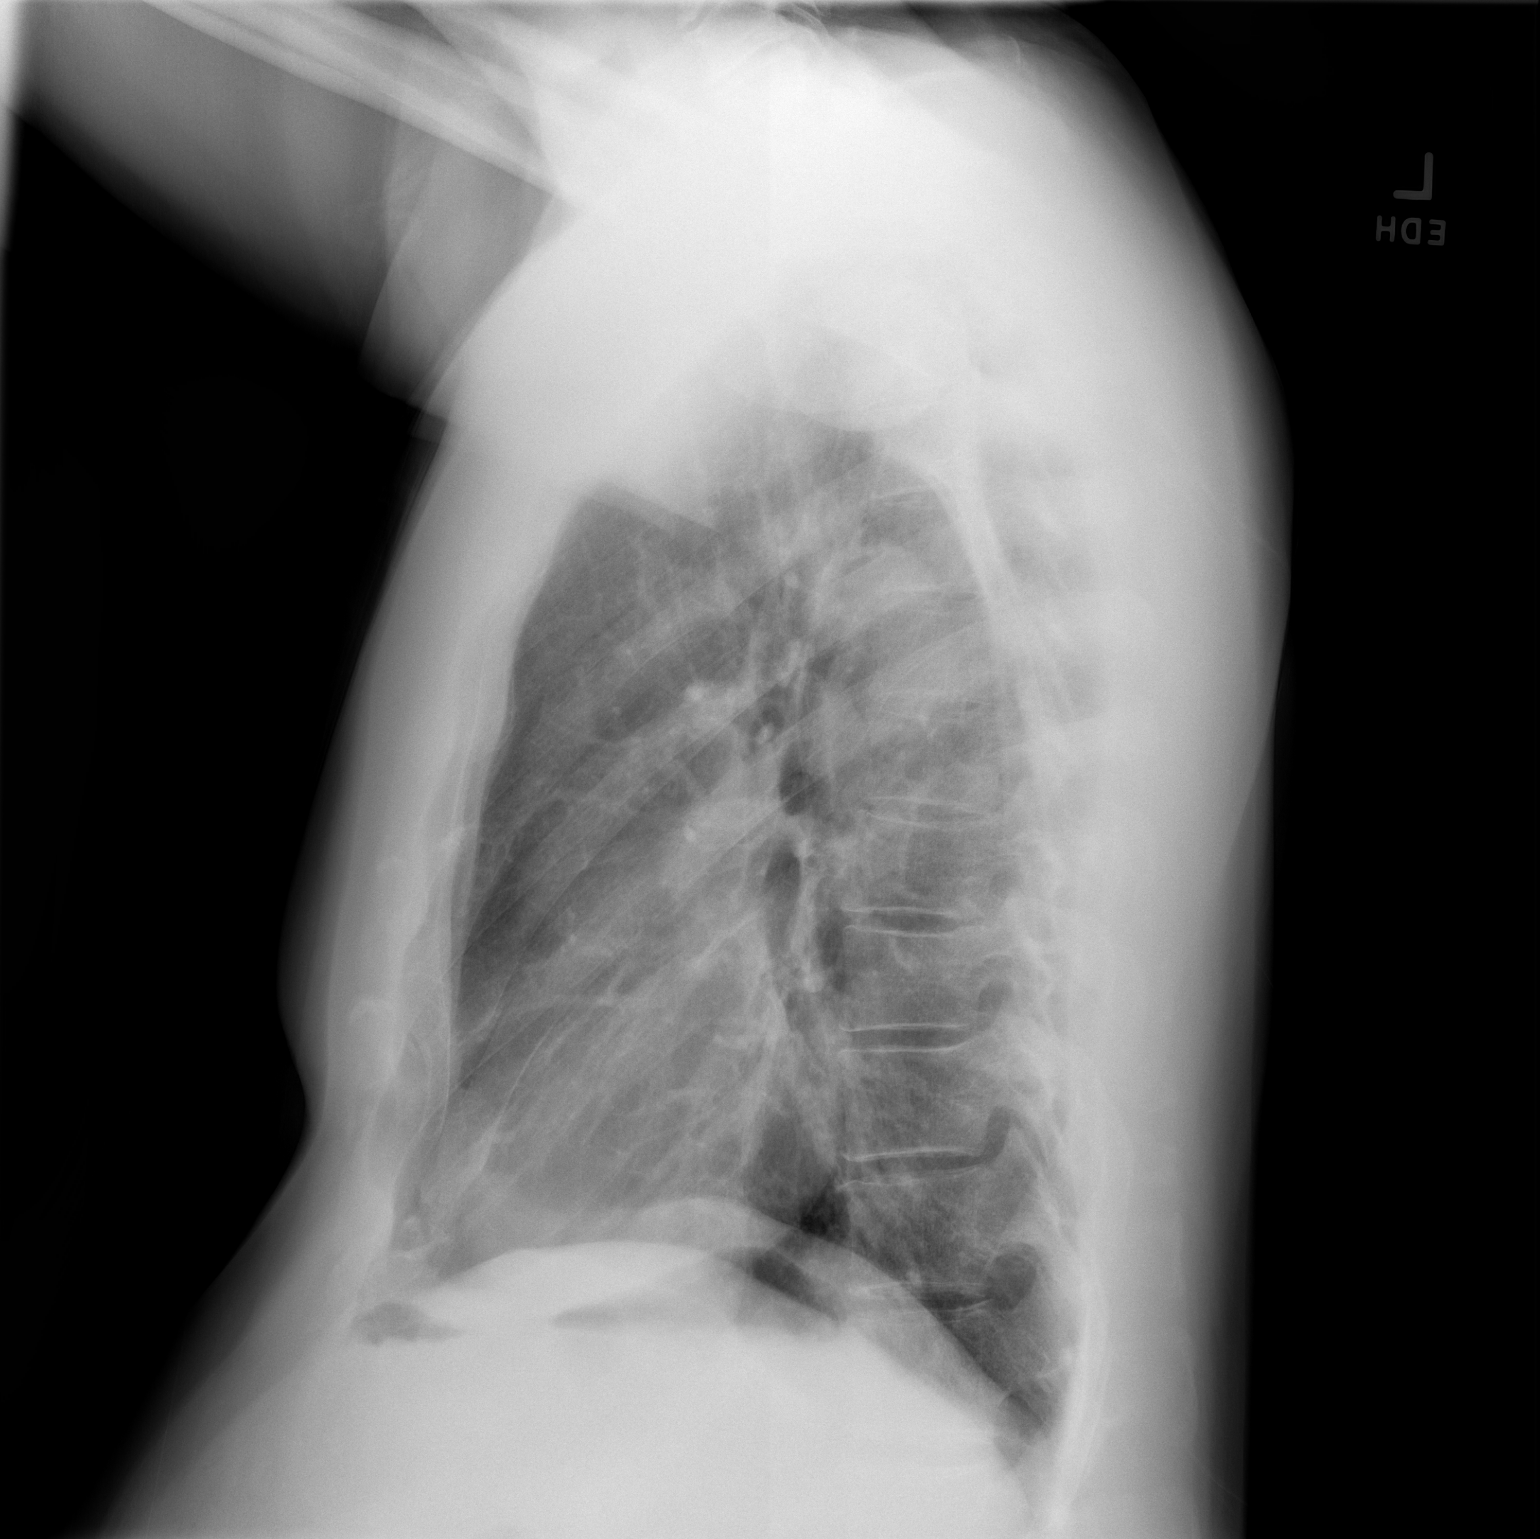

[2 of 2 positions shown; findings below may reference images not displayed]

FINDINGS: Mediastinum and hilar structures normal. Lungs are clear. No pleural
effusion or pneumothorax. Heart size normal.
IMPRESSION: No acute cardiopulmonary disease.

## 2022-08-01 ENCOUNTER — Other Ambulatory Visit (HOSPITAL_COMMUNITY): Payer: Self-pay | Admitting: Urology

## 2022-08-01 ENCOUNTER — Other Ambulatory Visit: Payer: Self-pay | Admitting: Urology

## 2022-08-01 DIAGNOSIS — R972 Elevated prostate specific antigen [PSA]: Secondary | ICD-10-CM

## 2022-08-16 ENCOUNTER — Encounter (HOSPITAL_BASED_OUTPATIENT_CLINIC_OR_DEPARTMENT_OTHER): Payer: Self-pay | Admitting: Urology

## 2022-08-16 NOTE — Progress Notes (Signed)
Spoke w/ via phone for pre-op interview--- Russell Benton needs dos----   Russell Benton)            Lab results------ COVID test -----patient states asymptomatic no test needed Arrive at -------1015 NPO after MN NO Solid Food.  Clear liquids from MN until---0915 Med rec completed Medications to take morning of surgery ----- Bring Albuterol inhaler Diabetic medication ----- Patient instructed no nail polish to be worn day of surgery Patient instructed to bring photo id and insurance card day of surgery Patient aware to have Driver (ride ) / caregiver  [name]  for 24 hours after surgery  Patient Special Instructions ----- Pre-Op special Istructions ----- FLEETS enema Patient verbalized understanding of instructions that were given at this phone interview. Patient denies shortness of breath, chest pain, fever, cough at this phone interview.

## 2022-08-26 ENCOUNTER — Ambulatory Visit (HOSPITAL_COMMUNITY): Admission: RE | Admit: 2022-08-26 | Payer: 59 | Source: Ambulatory Visit

## 2022-08-26 ENCOUNTER — Ambulatory Visit (HOSPITAL_BASED_OUTPATIENT_CLINIC_OR_DEPARTMENT_OTHER): Admission: RE | Admit: 2022-08-26 | Payer: 59 | Source: Home / Self Care | Admitting: Urology

## 2022-08-26 DIAGNOSIS — Z01818 Encounter for other preprocedural examination: Secondary | ICD-10-CM

## 2022-08-26 SURGERY — BIOPSY, PROSTATE, RECTAL APPROACH, WITH US GUIDANCE
Anesthesia: Monitor Anesthesia Care

## 2022-10-05 ENCOUNTER — Other Ambulatory Visit: Payer: Self-pay | Admitting: Urology

## 2022-11-21 ENCOUNTER — Encounter (HOSPITAL_BASED_OUTPATIENT_CLINIC_OR_DEPARTMENT_OTHER): Payer: Self-pay | Admitting: Urology

## 2022-11-21 NOTE — Progress Notes (Signed)
Spoke w/ via phone for pre-op interview--- Russell Benton needs dos----NONE               Benton results------ COVID test -----patient states asymptomatic no test needed Arrive at -------1030 NPO after MN NO Solid Food.  Clear liquids from MN until---0930 Med rec completed Medications to take morning of surgery -----Advair Diabetic medication ----- Patient instructed no nail polish to be worn day of surgery Patient instructed to bring photo id and insurance card day of surgery Patient aware to have Driver (ride ) / caregiver [name]   for 24 hours after surgery  Patient Special Instructions ----- Pre-Op special Istructions ----- FLEETS Enema AM of surgery. Patient verbalized understanding of instructions that were given at this phone interview. Patient denies shortness of breath, chest pain, fever, cough at this phone interview.

## 2022-11-25 ENCOUNTER — Ambulatory Visit (HOSPITAL_BASED_OUTPATIENT_CLINIC_OR_DEPARTMENT_OTHER): Payer: 59 | Admitting: Anesthesiology

## 2022-11-25 ENCOUNTER — Other Ambulatory Visit: Payer: Self-pay

## 2022-11-25 ENCOUNTER — Encounter (HOSPITAL_BASED_OUTPATIENT_CLINIC_OR_DEPARTMENT_OTHER)
Admission: RE | Disposition: A | Discharge status: Home / Self Care | Payer: Self-pay | Source: Ambulatory Visit | Attending: Urology

## 2022-11-25 ENCOUNTER — Ambulatory Visit (HOSPITAL_COMMUNITY)
Admission: RE | Admit: 2022-11-25 | Discharge: 2022-11-25 | Disposition: A | Discharge status: Home / Self Care | Payer: 59 | Source: Ambulatory Visit | Attending: Urology | Admitting: Urology

## 2022-11-25 ENCOUNTER — Encounter (HOSPITAL_BASED_OUTPATIENT_CLINIC_OR_DEPARTMENT_OTHER): Payer: Self-pay | Admitting: Urology

## 2022-11-25 ENCOUNTER — Ambulatory Visit (HOSPITAL_BASED_OUTPATIENT_CLINIC_OR_DEPARTMENT_OTHER)
Admission: RE | Admit: 2022-11-25 | Discharge: 2022-11-25 | Disposition: A | Discharge status: Home / Self Care | Payer: 59 | Source: Ambulatory Visit | Attending: Urology | Admitting: Urology

## 2022-11-25 DIAGNOSIS — E785 Hyperlipidemia, unspecified: Secondary | ICD-10-CM | POA: Diagnosis not present

## 2022-11-25 DIAGNOSIS — Z87891 Personal history of nicotine dependence: Secondary | ICD-10-CM | POA: Insufficient documentation

## 2022-11-25 DIAGNOSIS — N529 Male erectile dysfunction, unspecified: Secondary | ICD-10-CM | POA: Insufficient documentation

## 2022-11-25 DIAGNOSIS — R972 Elevated prostate specific antigen [PSA]: Secondary | ICD-10-CM

## 2022-11-25 DIAGNOSIS — C61 Malignant neoplasm of prostate: Secondary | ICD-10-CM | POA: Diagnosis not present

## 2022-11-25 DIAGNOSIS — J45909 Unspecified asthma, uncomplicated: Secondary | ICD-10-CM

## 2022-11-25 DIAGNOSIS — Z01818 Encounter for other preprocedural examination: Secondary | ICD-10-CM

## 2022-11-25 HISTORY — PX: PROSTATE BIOPSY: SHX241

## 2022-11-25 LAB — POCT I-STAT, CHEM 8
BUN: 18 mg/dL (ref 6–20)
Calcium, Ion: 1.23 mmol/L (ref 1.15–1.40)
Chloride: 101 mmol/L (ref 98–111)
Creatinine, Ser: 1.1 mg/dL (ref 0.61–1.24)
Glucose, Bld: 87 mg/dL (ref 70–99)
HCT: 42 % (ref 39.0–52.0)
Hemoglobin: 14.3 g/dL (ref 13.0–17.0)
Potassium: 4.2 mmol/L (ref 3.5–5.1)
Sodium: 139 mmol/L (ref 135–145)
TCO2: 27 mmol/L (ref 22–32)

## 2022-11-25 SURGERY — BIOPSY, PROSTATE, RECTAL APPROACH, WITH US GUIDANCE
Anesthesia: Monitor Anesthesia Care | Site: Prostate

## 2022-11-25 MED ORDER — PROPOFOL 10 MG/ML IV BOLUS
INTRAVENOUS | Status: DC | PRN
  Administered 2022-11-25: 20 mg via INTRAVENOUS

## 2022-11-25 MED ORDER — PROPOFOL 500 MG/50ML IV EMUL
INTRAVENOUS | Status: DC | PRN
  Administered 2022-11-25: 50 ug/kg/min via INTRAVENOUS

## 2022-11-25 MED ORDER — ONDANSETRON HCL 4 MG/2ML IJ SOLN: 4.0000 mg | Freq: Four times a day (QID) | INTRAMUSCULAR | Status: DC | PRN

## 2022-11-25 MED ORDER — ONDANSETRON HCL 4 MG/2ML IJ SOLN
INTRAMUSCULAR | Status: DC | PRN
  Administered 2022-11-25: 4 mg via INTRAVENOUS

## 2022-11-25 MED ORDER — OXYCODONE HCL 5 MG/5ML PO SOLN: 5.0000 mg | Freq: Once | ORAL | Status: DC | PRN

## 2022-11-25 MED ORDER — KETAMINE HCL 10 MG/ML IJ SOLN
INTRAMUSCULAR | Status: DC | PRN
  Administered 2022-11-25: 20 mg via INTRAVENOUS
  Administered 2022-11-25 (×3): 10 mg via INTRAVENOUS

## 2022-11-25 MED ORDER — MIDAZOLAM HCL 5 MG/5ML IJ SOLN
INTRAMUSCULAR | Status: DC | PRN
  Administered 2022-11-25: 2 mg via INTRAVENOUS
  Administered 2022-11-25 (×2): 1 mg via INTRAVENOUS

## 2022-11-25 MED ORDER — OXYCODONE HCL 5 MG PO TABS: 5.0000 mg | ORAL_TABLET | Freq: Once | ORAL | Status: DC | PRN

## 2022-11-25 MED ORDER — PHENYLEPHRINE 80 MCG/ML (10ML) SYRINGE FOR IV PUSH (FOR BLOOD PRESSURE SUPPORT)
PREFILLED_SYRINGE | INTRAVENOUS | Status: AC
  Filled 2022-11-25: qty 10

## 2022-11-25 MED ORDER — FENTANYL CITRATE (PF) 100 MCG/2ML IJ SOLN
25.0000 ug | INTRAMUSCULAR | Status: DC | PRN
  Administered 2022-11-25 (×2): 25 ug via INTRAVENOUS

## 2022-11-25 MED ORDER — FENTANYL CITRATE (PF) 100 MCG/2ML IJ SOLN
INTRAMUSCULAR | Status: DC | PRN
  Administered 2022-11-25 (×2): 25 ug via INTRAVENOUS

## 2022-11-25 MED ORDER — PROPOFOL 500 MG/50ML IV EMUL
INTRAVENOUS | Status: AC
  Filled 2022-11-25: qty 50

## 2022-11-25 MED ORDER — LACTATED RINGERS IV SOLN: INTRAVENOUS | Status: DC

## 2022-11-25 MED ORDER — KETAMINE HCL 50 MG/5ML IJ SOSY
PREFILLED_SYRINGE | INTRAMUSCULAR | Status: AC
  Filled 2022-11-25: qty 5

## 2022-11-25 MED ORDER — FENTANYL CITRATE (PF) 100 MCG/2ML IJ SOLN
INTRAMUSCULAR | Status: AC
  Filled 2022-11-25: qty 2

## 2022-11-25 MED ORDER — MIDAZOLAM HCL 2 MG/2ML IJ SOLN
INTRAMUSCULAR | Status: AC
  Filled 2022-11-25: qty 2

## 2022-11-25 MED ORDER — GENTAMICIN SULFATE 40 MG/ML IJ SOLN
440.0000 mg | INTRAVENOUS | Status: AC
  Administered 2022-11-25: 440 mg via INTRAVENOUS
  Filled 2022-11-25 (×2): qty 11

## 2022-11-25 MED ORDER — LIDOCAINE HCL 2 % IJ SOLN
INTRAMUSCULAR | Status: DC | PRN
  Administered 2022-11-25: 10 mL

## 2022-11-25 MED ORDER — FLEET ENEMA 7-19 GM/118ML RE ENEM: 1.0000 | ENEMA | Freq: Once | RECTAL | Status: DC

## 2022-11-25 MED ORDER — TRAMADOL HCL 50 MG PO TABS: 50.0000 mg | ORAL_TABLET | Freq: Four times a day (QID) | ORAL | 0 refills | Status: AC | PRN

## 2022-11-25 MED ORDER — PHENYLEPHRINE HCL (PRESSORS) 10 MG/ML IV SOLN
INTRAVENOUS | Status: DC | PRN
  Administered 2022-11-25 (×3): 80 ug via INTRAVENOUS

## 2022-11-25 MED ORDER — ONDANSETRON HCL 4 MG/2ML IJ SOLN
INTRAMUSCULAR | Status: AC
  Filled 2022-11-25: qty 2

## 2022-11-25 SURGICAL SUPPLY — 9 items
INST BIOPSY MAXCORE 18GX25 (NEEDLE) IMPLANT
INSTR BIOPSY MAXCORE 18GX20 (NEEDLE) IMPLANT
KIT TURNOVER CYSTO (KITS) ×2 IMPLANT
NDL SAFETY ECLIP 18X1.5 (MISCELLANEOUS) IMPLANT
NDL SPNL 22GX7 QUINCKE BK (NEEDLE) ×2 IMPLANT
NEEDLE HYPO 22GX1.5 SAFETY (NEEDLE) IMPLANT
NEEDLE SPNL 22GX7 QUINCKE BK (NEEDLE) ×1 IMPLANT
SYR CONTROL 10ML LL (SYRINGE) IMPLANT
UNDERPAD 30X36 HEAVY ABSORB (UNDERPADS AND DIAPERS) ×2 IMPLANT

## 2022-11-25 NOTE — Op Note (Signed)
  The note originally documented on this encounter has been moved the the encounter in which it belongs.  

## 2022-11-25 NOTE — H&P (Signed)
Russell Benton is an 57 y.o. male.    Chief Complaint: Pre-OP Prostate Biopsy Under Anesthesia / MAC  HPI:   1 - Elevated PSA - No FHx prostate cancer  2022 - PSA 2.49  03/2022 - PSA 6.0, DRE 50gm smooth (vagal episode during exam)  07/2022 - PSA 4.09 / 10% free (UNfavorable) ==> arranged OR biopsy but he last minute cancelled.  09/2022 - PSA 4.65   2 - Erectile Dysfunction - on sildenafil 61m since 579s   PMH sig for HLD, anxiety/benzos (rarely, but does have extreme anxiety around medical procedures). 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 His PCP is REarly OsmondMD with [facility]   Today " MCatalina Benton" is seen to proceed with prostate biopsy under anesthesia. NO interval fevers.    Past Medical History:  Diagnosis Date   Asthma    GERD (gastroesophageal reflux disease)     Past Surgical History:  Procedure Laterality Date   MR SHOULDER RIGHT     NASAL SINUS SURGERY Bilateral 03/09/2020   Procedure: Endoscopic Sinus Surgery: Maxillary, Ethmoid, and Frontal;  Surgeon: RIzora Gala MD;  [place] [facility]  Service: ENT;  Laterality: Bilateral;   NASAL SINUS SURGERY  03/09/2020    Family History  Problem Relation Age of Onset   Eczema Paternal Uncle    Asthma Maternal Grandfather    Social History:  reports that he quit smoking about 33 years ago. His smoking use included cigarettes. He has never used smokeless tobacco. He reports current alcohol use. He reports that he does not use drugs.  Allergies: No Known Allergies  No medications prior to admission.    No results found for this or any previous visit (from the past 48 hour(s)). No results found.  Review of Systems  Constitutional:  Negative for chills and fatigue.  All other systems reviewed and are negative.   Height _0  (1.803 m), weight 86.2 kg. Physical Exam Vitals reviewed.  Constitutional:      Comments: Pleasant, anxious, at baseline.   HENT:     Head: Normocephalic.   Eyes:     Pupils: Pupils are equal, round, and reactive to light.  Cardiovascular:     Rate and Rhythm: Normal rate.  Pulmonary:     Effort: Pulmonary effort is normal.  Abdominal:     General: Abdomen is flat.  Genitourinary:    Comments: No CVAT at present.  Musculoskeletal:        General: Normal range of motion.     Cervical back: Normal range of motion.  Skin:    General: Skin is warm.  Neurological:     General: No focal deficit present.     Mental Status: He is alert.      Assessment/Plan  Proceed as planned with prostate biopsy. Risks, benefits, alternatives, expected peri-op course discussed extensively previously over multiple encounters and reiterated today.   TAlexis Frock MD 11/25/2022, 7:21 AM

## 2022-11-25 NOTE — Anesthesia Postprocedure Evaluation (Signed)
Anesthesia Post Note  Patient: Russell Benton  Procedure(s) Performed: BIOPSY TRANSRECTAL ULTRASONIC PROSTATE (TUBP) (Prostate)     Anesthesia Type: MAC Anesthetic complications: no   No notable events documented.  Last Vitals:  Vitals:   11/25/22 1114  BP: 136/76  Pulse: 76  Resp: 16  Temp: 37.1 C  SpO2: 100%    Last Pain:  Vitals:   11/25/22 1114  TempSrc: Oral  PainSc: 0-No pain                 Gerber Penza

## 2022-11-25 NOTE — Transfer of Care (Signed)
Immediate Anesthesia Transfer of Care Note  Patient: Russell Benton  Procedure(s) Performed: Procedure(s) (LRB): BIOPSY TRANSRECTAL ULTRASONIC PROSTATE (TUBP) (N/A)  Patient Location: PACU  Anesthesia Type: MAC  Level of Consciousness: awake, sedated, patient cooperative and responds to stimulation  Airway & Oxygen Therapy: Patient Spontanous Breathing and Patient connected to FM oxygen  Post-op Assessment: Report given to PACU RN, Post -op Vital signs reviewed and stable and Patient moving all extremities  Post vital signs: Reviewed and stable  Complications: No apparent anesthesia complications

## 2022-11-25 NOTE — Brief Op Note (Signed)
11/25/2022  12:31 PM  PATIENT:  Russell Benton  57 y.o. male  PRE-OPERATIVE DIAGNOSIS:  ELEVATED PSA  POST-OPERATIVE DIAGNOSIS:  ELEVATED PSA  PROCEDURE:  Procedure(s) with comments: BIOPSY TRANSRECTAL ULTRASONIC PROSTATE (TUBP) (N/A) - 45 MINS  SURGEON:  Surgeon(s) and Role:    Alexis Frock, MD - Primary  PHYSICIAN ASSISTANT:   ASSISTANTS: none   ANESTHESIA:   local and MAC  EBL:  0 mL   BLOOD ADMINISTERED:none  DRAINS: none   LOCAL MEDICATIONS USED:  LIDOCAINE   SPECIMEN:  Source of Specimen:  12 core prostate  DISPOSITION OF SPECIMEN:  PATHOLOGY  COUNTS:  YES  TOURNIQUET:  * No tourniquets in log *  DICTATION: .Other Dictation: Dictation Number [id number]  PLAN OF CARE: Discharge to home after PACU  PATIENT DISPOSITION:  PACU - hemodynamically stable.   Delay start of Pharmacological VTE agent (>24hrs) due to surgical blood loss or risk of bleeding: not applicable

## 2022-11-25 NOTE — Discharge Instructions (Addendum)
1 - You may have small blood in the urine and stool for few days. There may be blood / rusty discoloration in semen for several weeks. This is normal.  2 - Call MD or go to ER for fever >102, severe pain / nausea / vomiting not relieved by medications, or acute change in medical status       Post Anesthesia Home Care Instructions  Activity: Get plenty of rest for the remainder of the day. A responsible individual must stay with you for 24 hours following the procedure.  For the next 24 hours, DO NOT: -Drive a car -Paediatric nurse -Drink alcoholic beverages -Take any medication unless instructed by your physician -Make any legal decisions or sign important papers.  Meals: Start with liquid foods such as gelatin or soup. Progress to regular foods as tolerated. Avoid greasy, spicy, heavy foods. If nausea and/or vomiting occur, drink only clear liquids until the nausea and/or vomiting subsides. Call your physician if vomiting continues.  Special Instructions/Symptoms: Your throat may feel dry or sore from the anesthesia or the breathing tube placed in your throat during surgery. If this causes discomfort, gargle with warm salt water. The discomfort should disappear within 24 hours.

## 2022-11-25 NOTE — OR Nursing (Addendum)
Pt walked to bathroom, while in the bathroom pt stated that he felt like he was going to pass out. Pt sat on the toilet and then passed out became pale and diaphoretic. Staff assisted pt to wheelchair and then to stretcher where he was placed in trendelenburg, simple mask. Pt became more alert and color changed back to pink. VS WNL. Anesthesia also called to bedside. Fluid bolus also administered

## 2022-11-25 NOTE — Anesthesia Procedure Notes (Signed)
Procedure Name: MAC Date/Time: 11/25/2022 12:10 PM  Performed by: Justice Rocher, CRNAPre-anesthesia Checklist: Timeout performed, Patient being monitored, Suction available, Emergency Drugs available and Patient identified Patient Re-evaluated:Patient Re-evaluated prior to induction Oxygen Delivery Method: Simple face mask Preoxygenation: Pre-oxygenation with 100% oxygen Induction Type: IV induction Placement Confirmation: breath sounds checked- equal and bilateral, CO2 detector and positive ETCO2

## 2022-11-25 NOTE — Anesthesia Preprocedure Evaluation (Signed)
Anesthesia Evaluation  Patient identified by MRN, date of birth, ID band Patient awake    Reviewed: Allergy & Precautions, H&P , NPO status , Patient's Chart, lab work & pertinent test results  Airway Mallampati: II   Neck ROM: full    Dental   Pulmonary asthma , former smoker   breath sounds clear to auscultation       Cardiovascular negative cardio ROS  Rhythm:regular Rate:Normal     Neuro/Psych    GI/Hepatic ,GERD  ,,  Endo/Other    Renal/GU      Musculoskeletal   Abdominal   Peds  Hematology   Anesthesia Other Findings   Reproductive/Obstetrics                             Anesthesia Physical Anesthesia Plan  ASA: 2  Anesthesia Plan: MAC   Post-op Pain Management:    Induction: Intravenous  PONV Risk Score and Plan: 1 and Propofol infusion and Treatment may vary due to age or medical condition  Airway Management Planned: Simple Face Mask  Additional Equipment:   Intra-op Plan:   Post-operative Plan:   Informed Consent: I have reviewed the patients History and Physical, chart, labs and discussed the procedure including the risks, benefits and alternatives for the proposed anesthesia with the patient or authorized representative who has indicated his/her understanding and acceptance.     Dental advisory given  Plan Discussed with: CRNA, Anesthesiologist and Surgeon  Anesthesia Plan Comments:        Anesthesia Quick Evaluation

## 2022-11-28 ENCOUNTER — Encounter (HOSPITAL_BASED_OUTPATIENT_CLINIC_OR_DEPARTMENT_OTHER): Payer: Self-pay | Admitting: Urology

## 2022-11-29 LAB — SURGICAL PATHOLOGY

## 2023-03-30 ENCOUNTER — Other Ambulatory Visit (HOSPITAL_COMMUNITY): Payer: Self-pay | Admitting: Internal Medicine

## 2023-03-30 DIAGNOSIS — Z136 Encounter for screening for cardiovascular disorders: Secondary | ICD-10-CM

## 2023-04-10 ENCOUNTER — Ambulatory Visit (HOSPITAL_COMMUNITY)
Admission: RE | Admit: 2023-04-10 | Discharge: 2023-04-10 | Disposition: A | Discharge status: Home / Self Care | Payer: 59 | Source: Ambulatory Visit | Attending: Internal Medicine | Admitting: Internal Medicine

## 2023-04-10 DIAGNOSIS — Z136 Encounter for screening for cardiovascular disorders: Secondary | ICD-10-CM | POA: Insufficient documentation

## 2023-10-10 ENCOUNTER — Other Ambulatory Visit: Payer: Self-pay | Admitting: Urology

## 2023-10-10 DIAGNOSIS — C61 Malignant neoplasm of prostate: Secondary | ICD-10-CM

## 2023-12-12 ENCOUNTER — Ambulatory Visit
Admission: RE | Admit: 2023-12-12 | Discharge: 2023-12-12 | Disposition: A | Discharge status: Home / Self Care | Payer: 59 | Source: Ambulatory Visit | Attending: Urology | Admitting: Urology

## 2023-12-12 DIAGNOSIS — C61 Malignant neoplasm of prostate: Secondary | ICD-10-CM

## 2023-12-12 MED ORDER — GADOPICLENOL 0.5 MMOL/ML IV SOLN
9.0000 mL | Freq: Once | INTRAVENOUS | Status: AC | PRN
  Administered 2023-12-12: 9 mL via INTRAVENOUS
